# Patient Record
Sex: Female | Born: 1980 | Race: Black or African American | Hispanic: No | Marital: Single | Smoking: Former smoker
Health system: Southern US, Community
[De-identification: ages and names within clinical notes are randomized; demographics above are authoritative.]

## PROBLEM LIST (undated history)

## (undated) DIAGNOSIS — J45909 Unspecified asthma, uncomplicated: Secondary | ICD-10-CM

## (undated) HISTORY — PX: APPENDECTOMY: SHX54

---

## 2015-08-29 ENCOUNTER — Emergency Department (HOSPITAL_BASED_OUTPATIENT_CLINIC_OR_DEPARTMENT_OTHER): Payer: Medicare Other

## 2015-08-29 ENCOUNTER — Emergency Department (HOSPITAL_BASED_OUTPATIENT_CLINIC_OR_DEPARTMENT_OTHER)
Admission: EM | Admit: 2015-08-29 | Discharge: 2015-08-29 | Disposition: A | Discharge status: Home / Self Care | Payer: Medicare Other | Attending: Emergency Medicine | Admitting: Emergency Medicine

## 2015-08-29 ENCOUNTER — Encounter (HOSPITAL_BASED_OUTPATIENT_CLINIC_OR_DEPARTMENT_OTHER): Payer: Self-pay | Admitting: *Deleted

## 2015-08-29 DIAGNOSIS — F17210 Nicotine dependence, cigarettes, uncomplicated: Secondary | ICD-10-CM | POA: Diagnosis not present

## 2015-08-29 DIAGNOSIS — M25561 Pain in right knee: Secondary | ICD-10-CM | POA: Diagnosis present

## 2015-08-29 IMAGING — DX DG KNEE COMPLETE 4+V*R*
4 series · 4 of 4 positions shown · non-contrast
Comparison: None.

CLINICAL DATA: Right knee pain for 2 days, no known injury, initial
encounter

EXAM:
RIGHT KNEE - COMPLETE 4+ VIEW

[knee ap]
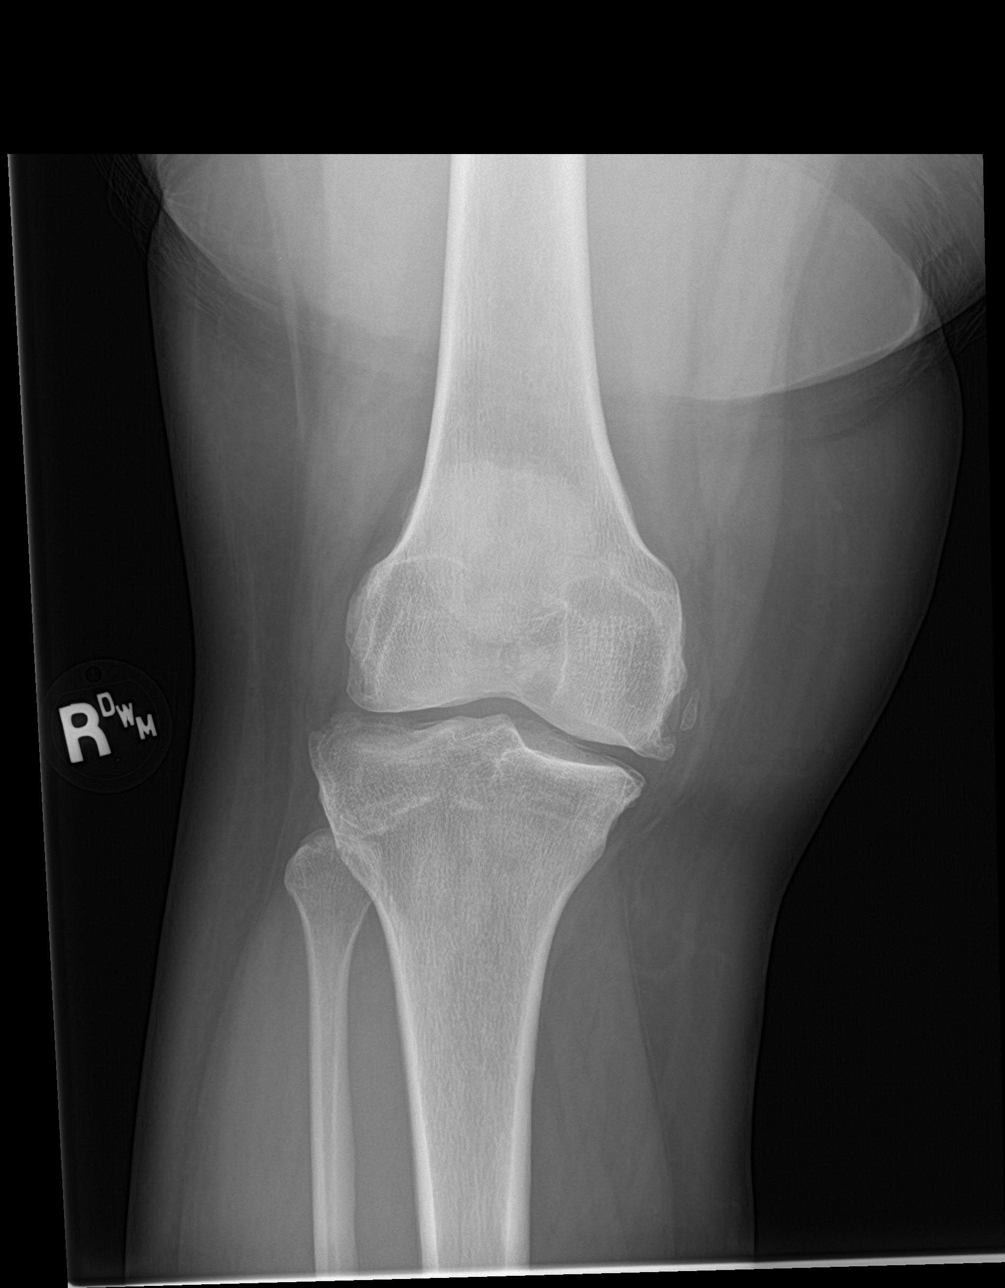

[knee lat]
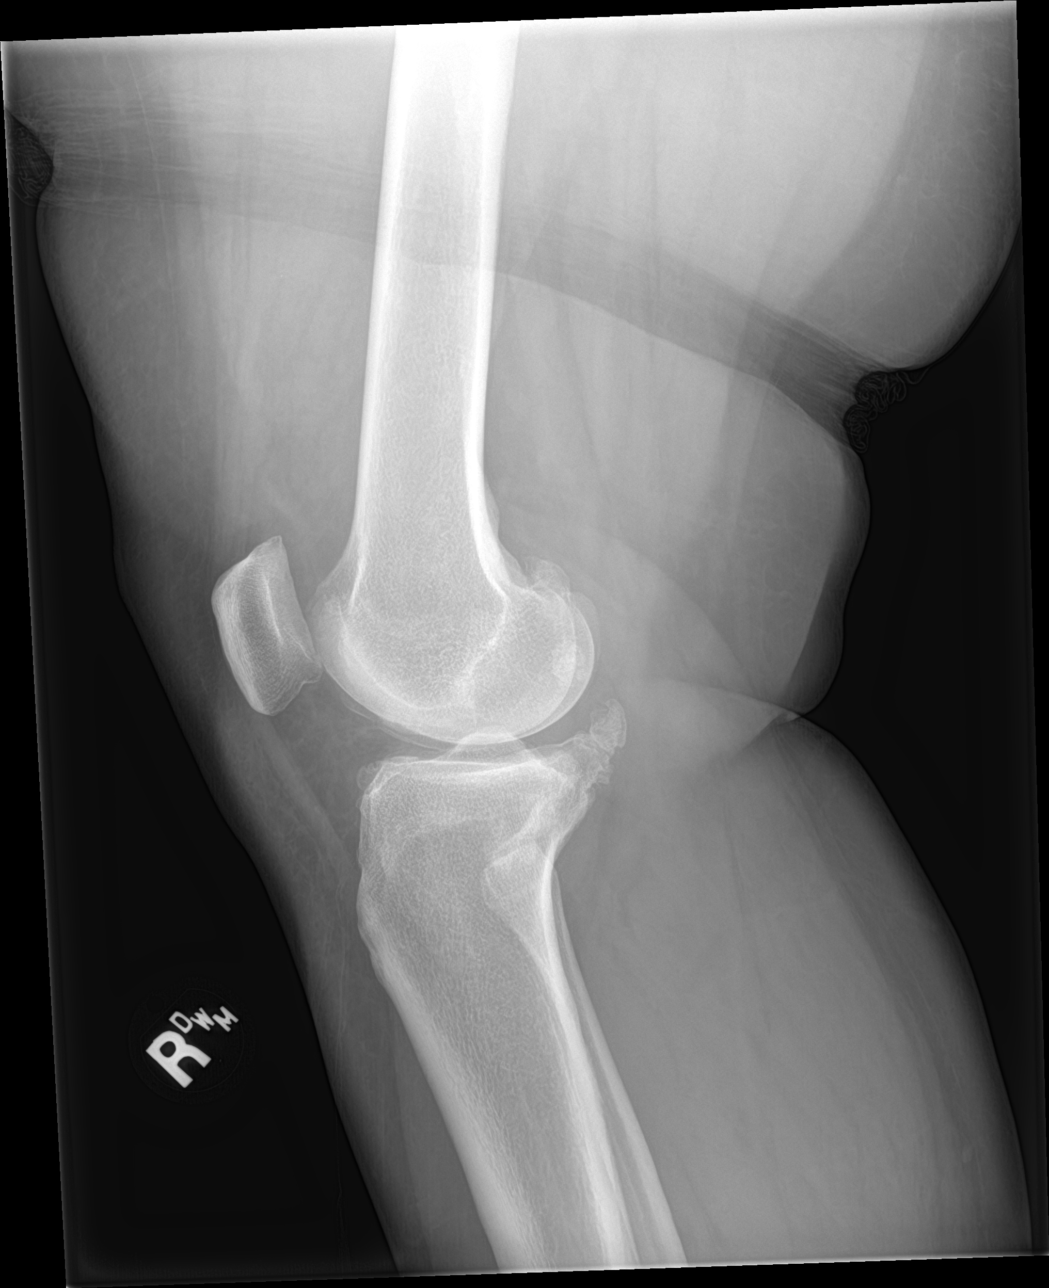

[knee obl (1 of 2)]
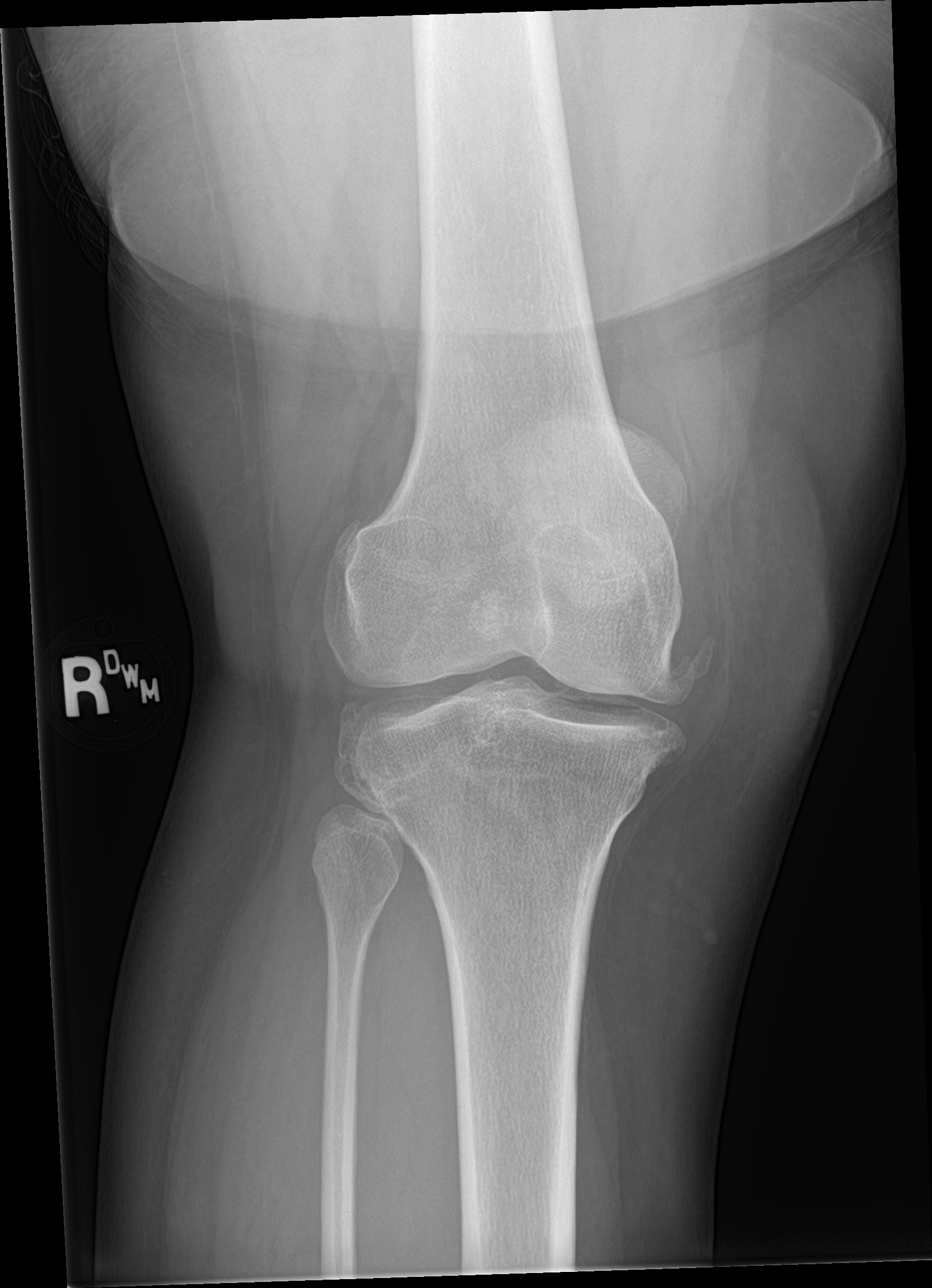

[knee obl (2 of 2)]
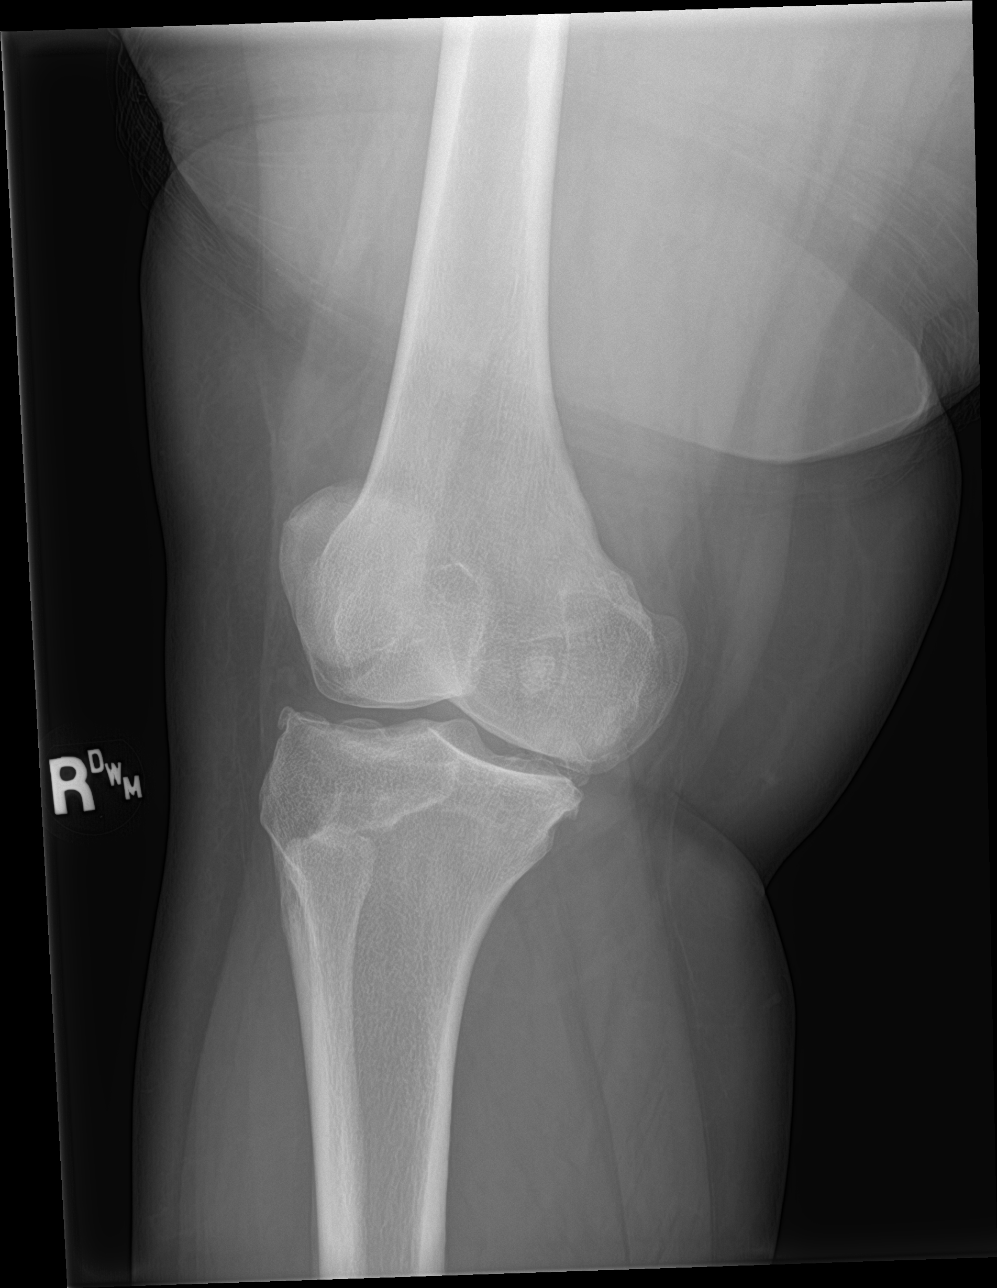

[4 of 4 positions shown; findings below may reference images not displayed]

FINDINGS: Significant degenerative changes are noted in all 3 joint
compartments. No joint effusion, acute fracture or dislocation is
noted. No gross soft tissue abnormality is seen.
IMPRESSION: Tricompartmental degenerative change without acute abnormality.

## 2015-08-29 MED ORDER — HYDROCODONE-ACETAMINOPHEN 5-325 MG PO TABS
1.0000 | ORAL_TABLET | Freq: Once | ORAL | Status: AC
  Administered 2015-08-29: 1 via ORAL
  Filled 2015-08-29: qty 1

## 2015-08-29 MED ORDER — IBUPROFEN 800 MG PO TABS: 800.0000 mg | ORAL_TABLET | Freq: Three times a day (TID) | ORAL | Status: DC

## 2015-08-29 NOTE — ED Notes (Signed)
CMS intact before and after. Pt tolerated well. All pt's questions answered.

## 2015-08-29 NOTE — ED Notes (Signed)
Right knee pain x 2 days but worse the past 2 hours. She denies injury.

## 2015-08-29 NOTE — ED Provider Notes (Addendum)
CSN: [id number]     Arrival date & time 08/29/15  1804 History  By signing my name below, I, Altamease Oiler, attest that this documentation has been prepared under the direction and in the presence of Merrily Pew, MD. Electronically Signed: Altamease Oiler, ED Scribe. 08/29/2015. 6:48 PM   Chief Complaint  Patient presents with  . Knee Pain    The history is provided by the patient. No language interpreter was used.   Angela Ayers is a 35 y.o. female who presents to the Emergency Department complaining of intermittent, 7/10 in severity, right knee pain with onset 2 days ago. The pain is exacerbated by bending the knee and standing for long periods. Pt has been taking 1000 mg of ibuprofen TID without sufficient relief in pain. She states that at night she has tingling in the knee and right leg and a heavy sensation after the tingling resolves. She has had no recent falls or trauma to the knee. Pt denies fever and hip pain.  She has no PCP.   History reviewed. No pertinent past medical history. Past Surgical History  Procedure Laterality Date  . Appendectomy     No family history on file. Social History  Substance Use Topics  . Smoking status: Current Some Day Smoker    Types: Cigarettes  . Smokeless tobacco: None  . Alcohol Use: No   OB History    No data available     Review of Systems  Musculoskeletal: Positive for arthralgias (right knee ).  All other systems reviewed and are negative.   Allergies  Latex  Home Medications   Prior to Admission medications   Medication Sig Start Date End Date Taking? Authorizing Provider  ibuprofen (ADVIL,MOTRIN) 800 MG tablet Take 1 tablet (800 mg total) by mouth every 8 (eight) hours. 08/29/15   Merrily Pew, MD   BP 124/99 mmHg  Pulse 120  Temp(Src) 98.6 F (37 C) (Oral)  Resp 20  Ht 5\' 5"  (1.651 m)  Wt 250 lb (113.399 kg)  BMI 41.60 kg/m2  SpO2 100%  LMP 08/07/2015 Physical Exam  Constitutional: She appears well-developed and  well-nourished.  HENT:  Head: Normocephalic and atraumatic.  Neck: Normal range of motion.  Cardiovascular: Normal rate and regular rhythm.   Pulmonary/Chest: No stridor. No respiratory distress.  Abdominal: She exhibits no distension.  Musculoskeletal:  No redness, warmth, or obvious swelling at the right knee. No pain with ROM or with load bearing on the heel.  Normal DP pulses   No LE edema   Neurological: She is alert.  Nursing note and vitals reviewed.   ED Course  Procedures (including critical care time) DIAGNOSTIC STUDIES: Oxygen Saturation is 100% on RA,  normal by my interpretation.    COORDINATION OF CARE: 6:46 PM Discussed treatment plan which includes right knee XR and pain medication with pt at bedside and pt agreed to plan.  Labs Review Labs Reviewed - No data to display  Imaging Review Dg Knee Complete 4 Views Right  08/29/2015  CLINICAL DATA:  Right knee pain for 2 days, no known injury, initial encounter EXAM: RIGHT KNEE - COMPLETE 4+ VIEW COMPARISON:  None. FINDINGS: Significant degenerative changes are noted in all 3 joint compartments. No joint effusion, acute fracture or dislocation is noted. No gross soft tissue abnormality is seen. IMPRESSION: Tricompartmental degenerative change without acute abnormality. Electronically Signed   By: Inez Catalina M.D.   On: 08/29/2015 18:35   I have personally reviewed and evaluated these images  as part of my medical decision-making.   EKG Interpretation None      MDM   Final diagnoses:  Right knee pain    35 year old female likely osteoarthritis of her knee. Low suspicion for gout or septic knee as she does not have a warm knee and she has no pain with range of motion. Will discharge on continuous NSAIDs and PCP follow-up if not improving.  New Prescriptions: Discharge Medication List as of 08/29/2015  7:21 PM    START taking these medications   Details  ibuprofen (ADVIL,MOTRIN) 800 MG tablet Take 1 tablet (800  mg total) by mouth every 8 (eight) hours., Starting 08/29/2015, Until Discontinued, Print         I have personally and contemperaneously reviewed labs and imaging and used in my decision making as above.   A medical screening exam was performed and I feel the patient has had an appropriate workup for their chief complaint at this time and likelihood of emergent condition existing is low and thus workup can continue on an outpatient basis.. Their vital signs are stable. They have been counseled on decision, discharge, follow up and which symptoms necessitate immediate return to the emergency department.  They verbally stated understanding and agreement with plan and discharged in stable condition.    I personally performed the services described in this documentation, which was scribed in my presence. The recorded information has been reviewed and is accurate.    Merrily Pew, MD 08/29/15 [id number]  Merrily Pew, MD 08/29/15 [phone]

## 2015-12-14 ENCOUNTER — Emergency Department (HOSPITAL_BASED_OUTPATIENT_CLINIC_OR_DEPARTMENT_OTHER)
Admission: EM | Admit: 2015-12-14 | Discharge: 2015-12-14 | Disposition: A | Discharge status: Home / Self Care | Payer: Medicare Other | Attending: Emergency Medicine | Admitting: Emergency Medicine

## 2015-12-14 ENCOUNTER — Encounter (HOSPITAL_BASED_OUTPATIENT_CLINIC_OR_DEPARTMENT_OTHER): Payer: Self-pay | Admitting: *Deleted

## 2015-12-14 ENCOUNTER — Emergency Department (HOSPITAL_BASED_OUTPATIENT_CLINIC_OR_DEPARTMENT_OTHER): Payer: Medicare Other

## 2015-12-14 DIAGNOSIS — R221 Localized swelling, mass and lump, neck: Secondary | ICD-10-CM | POA: Diagnosis not present

## 2015-12-14 DIAGNOSIS — K1120 Sialoadenitis, unspecified: Secondary | ICD-10-CM | POA: Insufficient documentation

## 2015-12-14 DIAGNOSIS — F17210 Nicotine dependence, cigarettes, uncomplicated: Secondary | ICD-10-CM | POA: Diagnosis not present

## 2015-12-14 DIAGNOSIS — J029 Acute pharyngitis, unspecified: Secondary | ICD-10-CM | POA: Diagnosis present

## 2015-12-14 LAB — COMPREHENSIVE METABOLIC PANEL
ALBUMIN: 3.9 g/dL (ref 3.5–5.0)
ALK PHOS: 51 U/L (ref 38–126)
ALT: 11 U/L — AB (ref 14–54)
AST: 13 U/L — AB (ref 15–41)
Anion gap: 7 (ref 5–15)
BILIRUBIN TOTAL: 0.3 mg/dL (ref 0.3–1.2)
BUN: 10 mg/dL (ref 6–20)
CALCIUM: 9.1 mg/dL (ref 8.9–10.3)
CO2: 22 mmol/L (ref 22–32)
CREATININE: 0.68 mg/dL (ref 0.44–1.00)
Chloride: 108 mmol/L (ref 101–111)
GFR calc Af Amer: >60 mL/min (ref >60)
GFR calc non Af Amer: >60 mL/min (ref >60)
GLUCOSE: 99 mg/dL (ref 65–99)
Potassium: 3.9 mmol/L (ref 3.5–5.1)
SODIUM: 137 mmol/L (ref 135–145)
TOTAL PROTEIN: 7.3 g/dL (ref 6.5–8.1)

## 2015-12-14 LAB — CBC WITH DIFFERENTIAL/PLATELET
BASOS ABS: 0 10*3/uL (ref 0.0–0.1)
BASOS PCT: 0 %
EOS ABS: 0.4 10*3/uL (ref 0.0–0.7)
Eosinophils Relative: 5 %
HEMATOCRIT: 37.5 % (ref 36.0–46.0)
HEMOGLOBIN: 13.1 g/dL (ref 12.0–15.0)
Lymphocytes Relative: 46 %
Lymphs Abs: 3.7 10*3/uL (ref 0.7–4.0)
MCH: 29.6 pg (ref 26.0–34.0)
MCHC: 34.9 g/dL (ref 30.0–36.0)
MCV: 84.7 fL (ref 78.0–100.0)
MONOS PCT: 7 %
Monocytes Absolute: 0.6 10*3/uL (ref 0.1–1.0)
NEUTROS ABS: 3.4 10*3/uL (ref 1.7–7.7)
NEUTROS PCT: 42 %
Platelets: 362 10*3/uL (ref 150–400)
RBC: 4.43 MIL/uL (ref 3.87–5.11)
RDW: 13.1 % (ref 11.5–15.5)
WBC: 8.1 10*3/uL (ref 4.0–10.5)

## 2015-12-14 LAB — RAPID STREP SCREEN (MED CTR MEBANE ONLY): Streptococcus, Group A Screen (Direct): NEGATIVE

## 2015-12-14 LAB — HCG, SERUM, QUALITATIVE: Preg, Serum: NEGATIVE

## 2015-12-14 IMAGING — CT CT NECK W/ CM
4 of 5 series · 16 of 33 positions shown, 18 images · IV contrast (iopamidol)
Comparison: None.

CLINICAL DATA: Sore throat for 2 days with anterior neck swelling.

EXAM:
CT NECK WITH CONTRAST
TECHNIQUE: Multidetector CT imaging of the neck was performed using the
standard protocol following the bolus administration of intravenous
contrast.
CONTRAST:  75mL [S3] IOPAMIDOL ([S3]) INJECTION 61%

[Series 3: axial neck · axial · 0.44mm/px · z∈[-288,-150]mm · 4 of 116 slices shown, 5 images]
[im 24/116  soft-tissue]
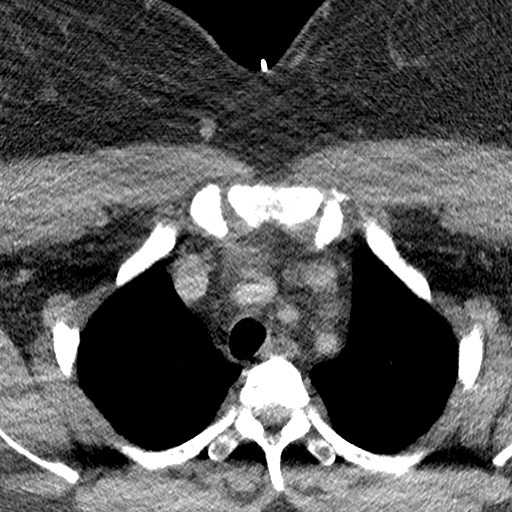
[im 24/116  bone]
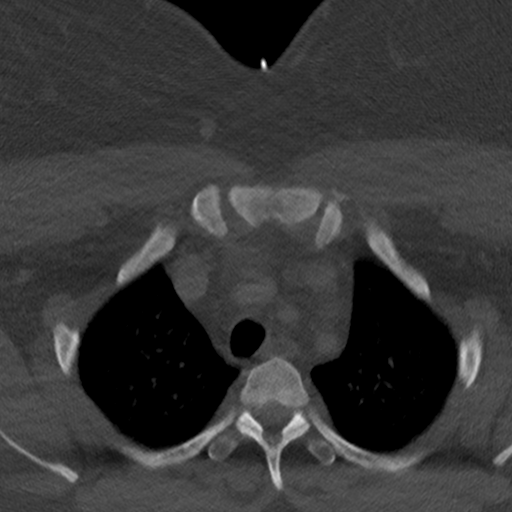
[im 47/116  bone]
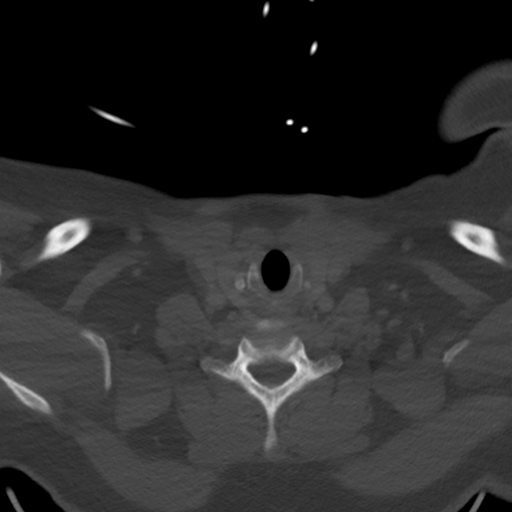
[im 70/116  bone]
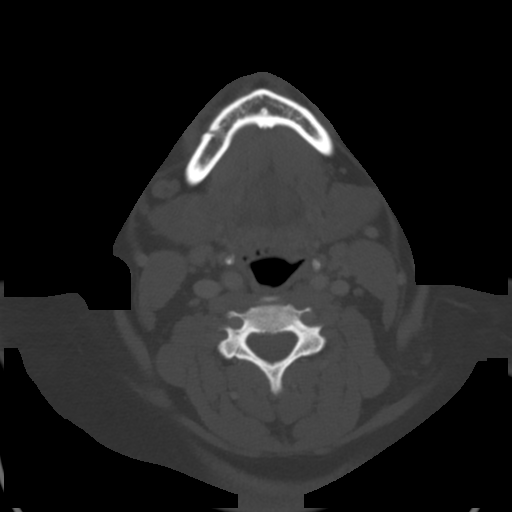
[im 93/116  bone]
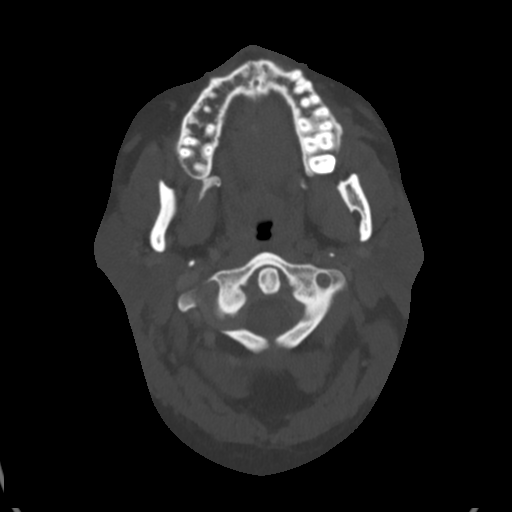

[Series 5: sag neck · sagittal · 0.52mm/px · 5 of 90 slices shown, 6 images]
[im 30/90  bone]
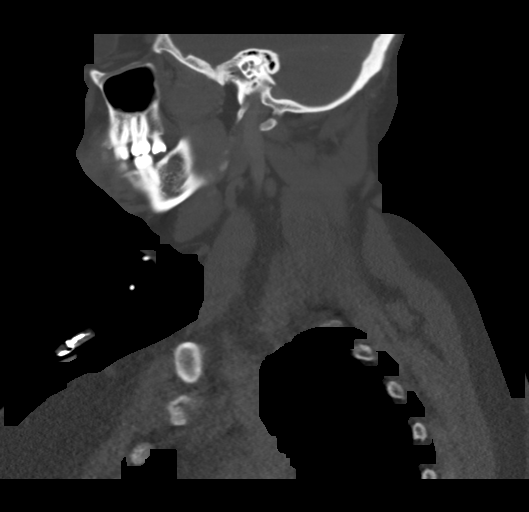
[im 38/90  bone]
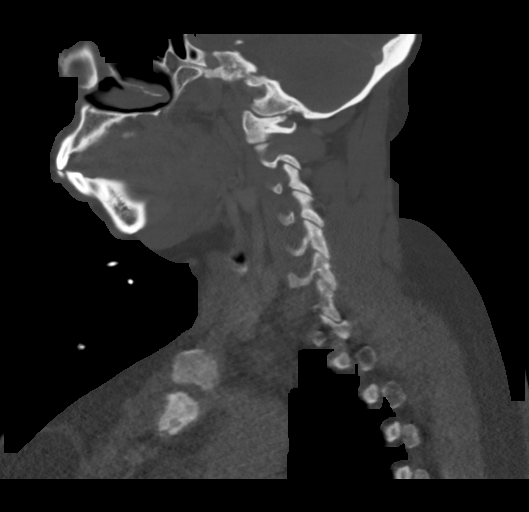
[im 45/90  soft-tissue]
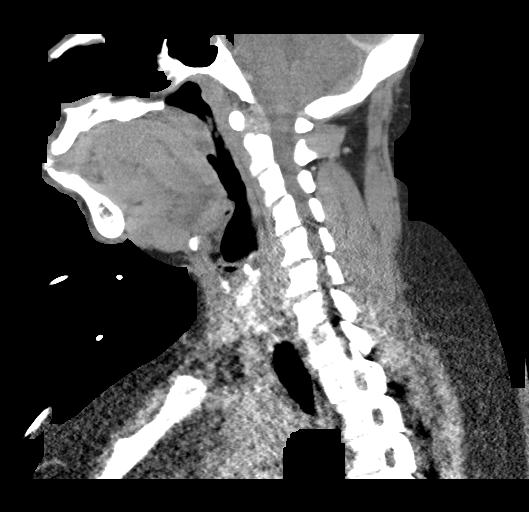
[im 45/90  bone]
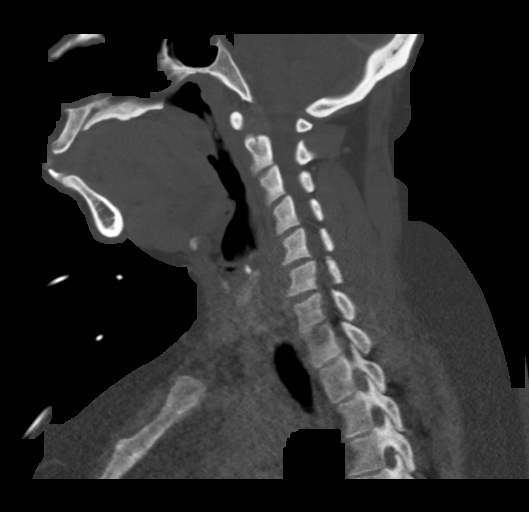
[im 52/90  bone]
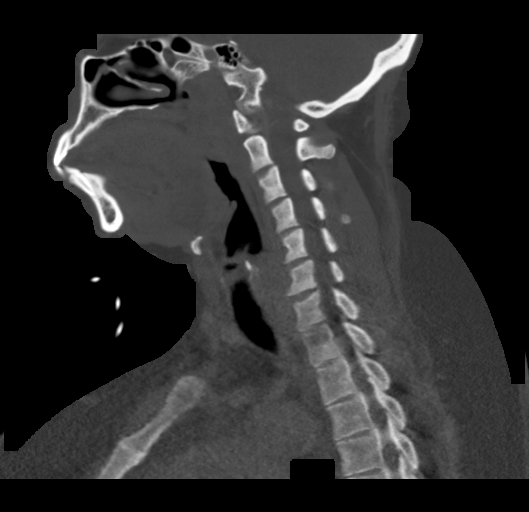
[im 60/90  bone]
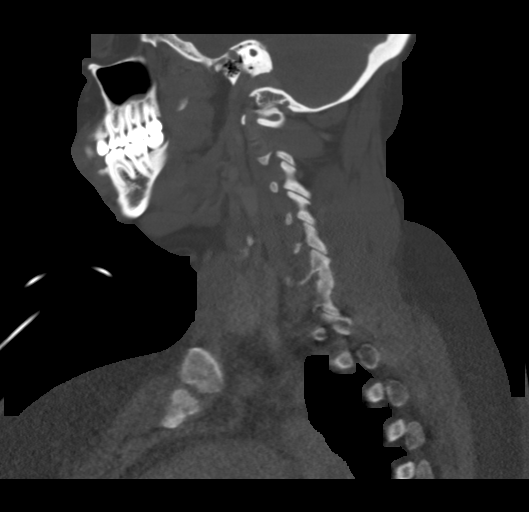

[Series 6: cor neck · coronal · 0.43mm/px · 3 of 119 slices shown]
[im 33/119  bone]
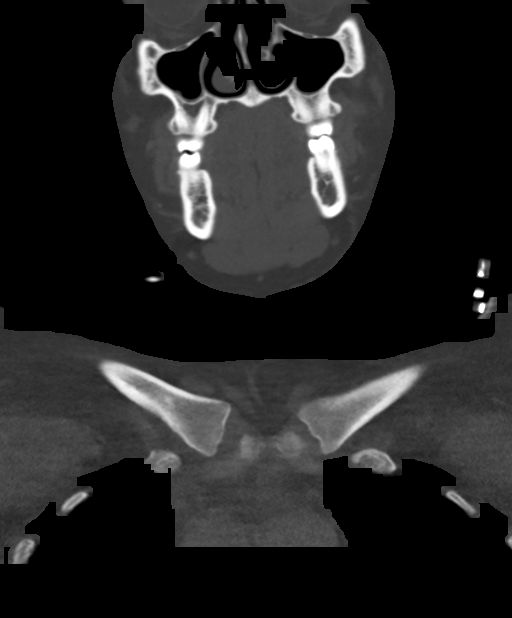
[im 51/119  bone]
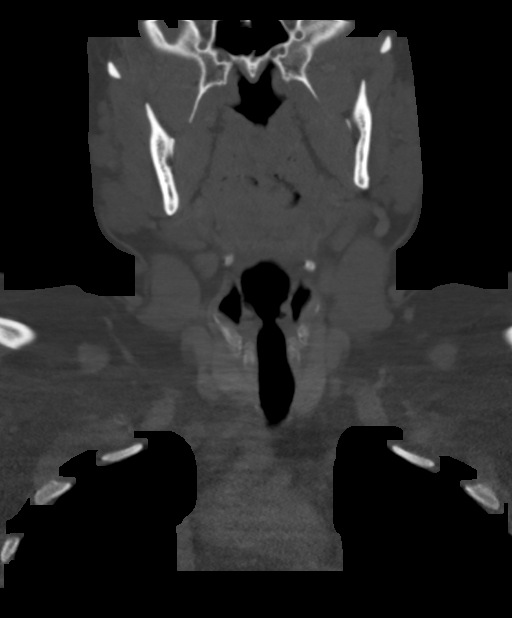
[im 69/119  bone]
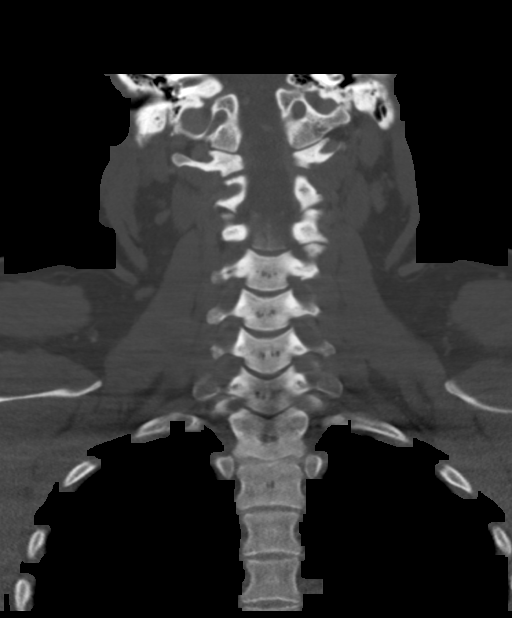

[Series 7: orthogonal ax · axial · 0.45mm/px · z∈[-308,-158]mm · 4 of 127 slices shown]
[im 26/127  bone]
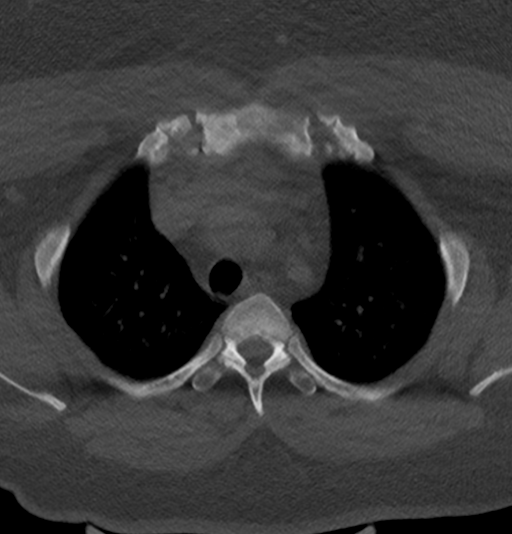
[im 51/127  bone]
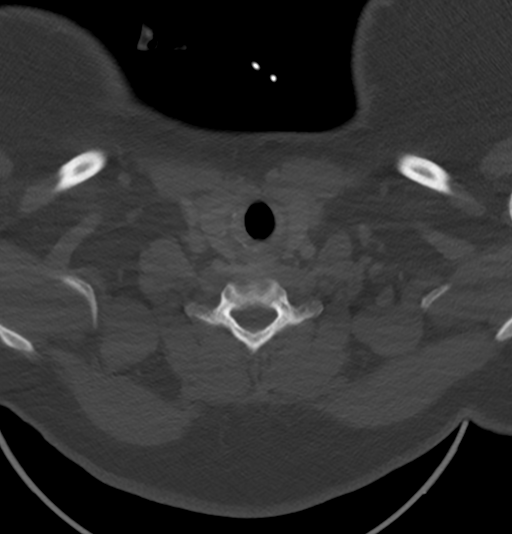
[im 76/127  bone]
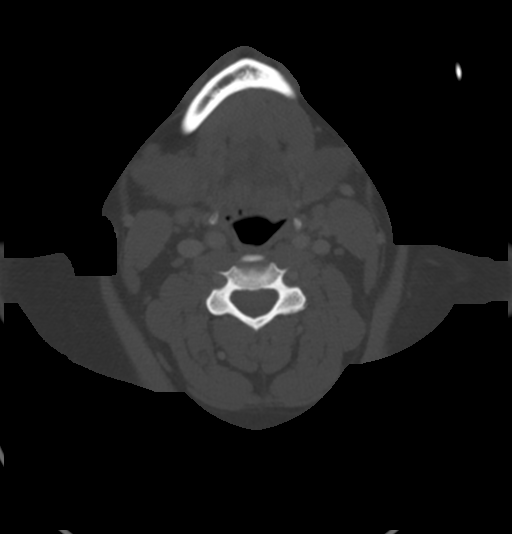
[im 101/127  bone]
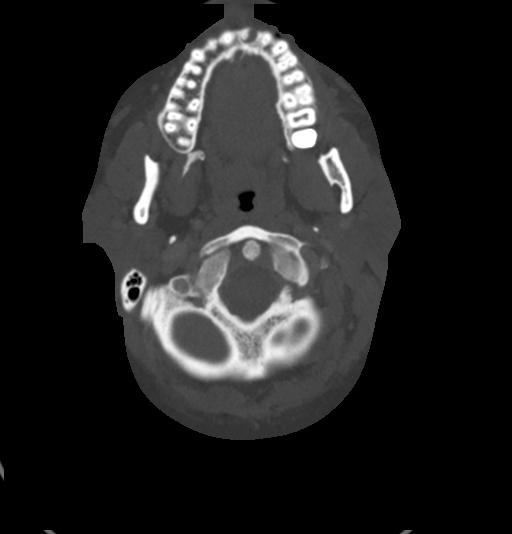

[16 of 33 positions shown; findings below may reference images not displayed]

FINDINGS: Limited by motion and soft tissue attenuation inferiorly. Exam is
overall diagnostic.

Pharynx and larynx: Normal. No mass or swelling.

Salivary glands: The submandibular glands are somewhat prominent but
symmetric and without surrounding fat inflammation. No calculi or
duct dilatation.

Thyroid: Affected by above artifact.  No evidence of thyroiditis.

Lymph nodes: None enlarged or abnormal density.

Vascular: Negative.

Limited intracranial: Negative.

Visualized orbits: Negative.

Mastoids and visualized paranasal sinuses: Clear.

Skeleton: No acute or aggressive process.

Upper chest: Negative.

Other: None.
IMPRESSION: Negative.  No explanation for symptoms.

## 2015-12-14 MED ORDER — DIPHENHYDRAMINE HCL 50 MG/ML IJ SOLN
25.0000 mg | Freq: Once | INTRAMUSCULAR | Status: AC
  Administered 2015-12-14: 25 mg via INTRAVENOUS
  Filled 2015-12-14: qty 1

## 2015-12-14 MED ORDER — SODIUM CHLORIDE 0.9 % IV SOLN
Freq: Once | INTRAVENOUS | Status: AC
  Administered 2015-12-14: 08:00:00 via INTRAVENOUS

## 2015-12-14 MED ORDER — PREDNISONE 10 MG PO TABS: 20.0000 mg | ORAL_TABLET | Freq: Every day | ORAL | 0 refills | Status: DC

## 2015-12-14 MED ORDER — IBUPROFEN 600 MG PO TABS: 600.0000 mg | ORAL_TABLET | Freq: Four times a day (QID) | ORAL | 0 refills | Status: DC | PRN

## 2015-12-14 MED ORDER — DEXAMETHASONE SODIUM PHOSPHATE 10 MG/ML IJ SOLN
10.0000 mg | Freq: Four times a day (QID) | INTRAMUSCULAR | Status: DC
  Administered 2015-12-14: 10 mg via INTRAVENOUS
  Filled 2015-12-14: qty 1

## 2015-12-14 MED ORDER — CLINDAMYCIN HCL 300 MG PO CAPS: 300.0000 mg | ORAL_CAPSULE | Freq: Four times a day (QID) | ORAL | 0 refills | Status: DC

## 2015-12-14 MED ORDER — IOPAMIDOL (ISOVUE-300) INJECTION 61%
75.0000 mL | Freq: Once | INTRAVENOUS | Status: AC | PRN
  Administered 2015-12-14: 75 mL via INTRAVENOUS

## 2015-12-14 MED ORDER — CLINDAMYCIN PHOSPHATE 600 MG/50ML IV SOLN
600.0000 mg | Freq: Once | INTRAVENOUS | Status: AC
  Administered 2015-12-14: 600 mg via INTRAVENOUS
  Filled 2015-12-14: qty 50

## 2015-12-14 MED ORDER — KETOROLAC TROMETHAMINE 30 MG/ML IJ SOLN
30.0000 mg | Freq: Once | INTRAMUSCULAR | Status: AC
  Administered 2015-12-14: 30 mg via INTRAVENOUS
  Filled 2015-12-14: qty 1

## 2015-12-14 MED FILL — IBUPROFEN 600 MG TABLET: 600 | 8 days supply | Qty: 30 | Fill #0

## 2015-12-14 MED FILL — predniSONE 10 MG TABS: 10 | 5 days supply | Qty: 10 | Fill #0

## 2015-12-14 MED FILL — CLINDAMYCIN HCL 300 MG CAPS: 300 | 7 days supply | Qty: 28 | Fill #0

## 2015-12-14 NOTE — ED Triage Notes (Signed)
C/o sore throat 2 days ago. Now has bil ear pain. Hurts to swallow.

## 2015-12-14 NOTE — ED Notes (Signed)
Pt states she feels better. States her throat does not feel as swelled.

## 2015-12-14 NOTE — ED Notes (Signed)
Pt directed to pharmacy to pick up prescriptions

## 2015-12-14 NOTE — ED Notes (Signed)
MD at bedside. 

## 2015-12-14 NOTE — ED Provider Notes (Signed)
[place] [facility] Provider Note   CSN: [id number] Arrival date & time: 12/14/15  [id number]     History   Chief Complaint Chief Complaint  Patient presents with  . Sore Throat    HPI Angela Ayers is a 35 y.o. female.  HPI Patient presents with 2 days of gradual throat swelling and pain with swallowing. States she's having trouble lying flat and tolerating secretions. No fever or chills. No new rashes or exposures. Denies any voice changes. History reviewed. No pertinent past medical history.  There are no active problems to display for this patient.   Past Surgical History:  Procedure Laterality Date  . APPENDECTOMY      OB History    No data available       Home Medications    Prior to Admission medications   Medication Sig Start Date End Date Taking? Authorizing Provider  clindamycin (CLEOCIN) 300 MG capsule Take 1 capsule (300 mg total) by mouth 4 (four) times daily. X 7 days 12/14/15   Julianne Rice, MD  ibuprofen (ADVIL,MOTRIN) 600 MG tablet Take 1 tablet (600 mg total) by mouth every 6 (six) hours as needed. 12/14/15   Julianne Rice, MD  predniSONE (DELTASONE) 10 MG tablet Take 2 tablets (20 mg total) by mouth daily. 12/14/15   Julianne Rice, MD    Family History No family history on file.  Social History Social History  Substance Use Topics  . Smoking status: Current Some Day Smoker    Packs/day: 0.50    Types: Cigarettes  . Smokeless tobacco: Never Used  . Alcohol use No     Allergies   Latex   Review of Systems Review of Systems  Constitutional: Negative for chills and fever.  HENT: Positive for facial swelling, sore throat and trouble swallowing. Negative for congestion, rhinorrhea and voice change.   Respiratory: Negative for shortness of breath, wheezing and stridor.   Cardiovascular: Negative for chest pain.  Musculoskeletal: Positive for neck pain. Negative for neck stiffness.  Skin: Negative for rash.  All other systems reviewed  and are negative.    Physical Exam Updated Vital Signs BP 119/85 (BP Location: Left Arm)   Pulse 78   Temp 97.8 F (36.6 C) (Oral)   Resp 18   Ht 5\' 5"  (1.651 m)   Wt 250 lb (113.4 kg)   LMP 11/24/2015   SpO2 95%   BMI 41.60 kg/m   Physical Exam  Constitutional: She is oriented to person, place, and time. She appears well-developed and well-nourished.  Patient appears uncomfortable. Leaning forward and drooling.  HENT:  Head: Normocephalic and atraumatic.  Mouth/Throat: Oropharynx is clear and moist.  Oropharynx is mildly erythematous. No obvious asymmetric masses.  Eyes: EOM are normal. Pupils are equal, round, and reactive to light.  Neck: Normal range of motion. Neck supple.  Fullness bilaterally under the mandible. Diffuse tenderness to palpation.  Cardiovascular: Normal rate and regular rhythm.   Pulmonary/Chest: Effort normal and breath sounds normal. No stridor. No respiratory distress. She has no wheezes. She has no rales. She exhibits no tenderness.  Abdominal: Soft. Bowel sounds are normal. There is no tenderness. There is no rebound and no guarding.  Musculoskeletal: Normal range of motion. She exhibits no edema or tenderness.  Lymphadenopathy:    She has cervical adenopathy.  Neurological: She is alert and oriented to person, place, and time.  Moving all extremities without deficit. Sensation intact.  Skin: Skin is warm and dry. No rash noted. No erythema.  Psychiatric: She has a normal mood and affect. Her behavior is normal.  Nursing note and vitals reviewed.    ED Treatments / Results  Labs (all labs ordered are listed, but only abnormal results are displayed) Labs Reviewed  COMPREHENSIVE METABOLIC PANEL - Abnormal; Notable for the following:       Result Value   AST 13 (*)    ALT 11 (*)    All other components within normal limits  RAPID STREP SCREEN (NOT AT [facility])  CULTURE, GROUP A STREP ([facility])  CBC WITH DIFFERENTIAL/PLATELET  HCG, SERUM,  QUALITATIVE    EKG  EKG Interpretation None       Radiology Ct Soft Tissue Neck W Contrast  Result Date: 12/14/2015 CLINICAL DATA:  Sore throat for 2 days with anterior neck swelling. EXAM: CT NECK WITH CONTRAST TECHNIQUE: Multidetector CT imaging of the neck was performed using the standard protocol following the bolus administration of intravenous contrast. CONTRAST:  75mL ISOVUE-300 IOPAMIDOL (ISOVUE-300) INJECTION 61% COMPARISON:  None. FINDINGS: Limited by motion and soft tissue attenuation inferiorly. Exam is overall diagnostic. Pharynx and larynx: Normal. No mass or swelling. Salivary glands: The submandibular glands are somewhat prominent but symmetric and without surrounding fat inflammation. No calculi or duct dilatation. Thyroid: Affected by above artifact.  No evidence of thyroiditis. Lymph nodes: None enlarged or abnormal density. Vascular: Negative. Limited intracranial: Negative. Visualized orbits: Negative. Mastoids and visualized paranasal sinuses: Clear. Skeleton: No acute or aggressive process. Upper chest: Negative. Other: None. IMPRESSION: Negative.  No explanation for symptoms. Electronically Signed   By: Marnee SpringJonathon  Watts M.D.   On: 12/14/2015 09:26    Procedures Procedures (including critical care time)  Medications Ordered in ED Medications  dexamethasone (DECADRON) injection 10 mg (10 mg Intravenous Given 12/14/15 0816)  diphenhydrAMINE (BENADRYL) injection 25 mg (25 mg Intravenous Given 12/14/15 0817)  ketorolac (TORADOL) 30 MG/ML injection 30 mg (30 mg Intravenous Given 12/14/15 0817)  0.9 %  sodium chloride infusion ( Intravenous New Bag/Given 12/14/15 0817)  iopamidol (ISOVUE-300) 61 % injection 75 mL (75 mLs Intravenous Contrast Given 12/14/15 0850)  clindamycin (CLEOCIN) IVPB 600 mg (0 mg Intravenous Stopped 12/14/15 1333)     Initial Impression / Assessment and Plan / ED Course  I have reviewed the triage vital signs and the nursing notes.  Pertinent labs &  imaging results that were available during my care of the patient were reviewed by me and considered in my medical decision making (see chart for details).  Clinical Course    Patient moved to trauma room. Will have emergent airway cart at bedside. We'll observe very closely. Given Decadron and Benadryl, though this may possibly be infectious.  Swelling has improved. Patient states her throat is not near as sore and she is tolerating her secretions better. CT without any acute findings.  Patient's doing much better. She is resting comfortably. She is in no respiratory distress. She is tolerating her secretions. Voice is clear. Patient has been observed for 5 hours in the emergency department. We'll discharge home with strict return precautions to return for any worsening of her symptoms or for any concerns. We'll give 5 day supply of prednisone and follow-up with ENT.  Discussed with Dr. Suszanne Connerseoh on. Recommend starting clindamycin. Will see her in the next 1 to 2 days. Final Clinical Impressions(s) / ED Diagnoses   Final diagnoses:  Throat swelling  Sialadenitis    New Prescriptions New Prescriptions   CLINDAMYCIN (CLEOCIN) 300 MG CAPSULE    Take 1  capsule (300 mg total) by mouth 4 (four) times daily. X 7 days   IBUPROFEN (ADVIL,MOTRIN) 600 MG TABLET    Take 1 tablet (600 mg total) by mouth every 6 (six) hours as needed.   PREDNISONE (DELTASONE) 10 MG TABLET    Take 2 tablets (20 mg total) by mouth daily.     Julianne Rice, MD 12/14/15 1416

## 2015-12-14 NOTE — ED Notes (Signed)
Pt moved to Room 14 per Dr Lita Mains request. Pt placed on cardiac monitor with pulse ox.

## 2015-12-17 LAB — CULTURE, GROUP A STREP (THRC)

## 2016-01-12 ENCOUNTER — Other Ambulatory Visit (INDEPENDENT_AMBULATORY_CARE_PROVIDER_SITE_OTHER): Payer: Self-pay | Admitting: Otolaryngology

## 2016-01-12 DIAGNOSIS — R1310 Dysphagia, unspecified: Secondary | ICD-10-CM

## 2016-01-19 ENCOUNTER — Ambulatory Visit (HOSPITAL_COMMUNITY)
Admission: RE | Admit: 2016-01-19 | Discharge: 2016-01-19 | Disposition: A | Discharge status: Home / Self Care | Payer: Medicare Other | Source: Ambulatory Visit | Attending: Otolaryngology | Admitting: Otolaryngology

## 2016-01-19 DIAGNOSIS — R1310 Dysphagia, unspecified: Secondary | ICD-10-CM

## 2016-01-19 DIAGNOSIS — R1312 Dysphagia, oropharyngeal phase: Secondary | ICD-10-CM | POA: Insufficient documentation

## 2016-01-19 DIAGNOSIS — K219 Gastro-esophageal reflux disease without esophagitis: Secondary | ICD-10-CM | POA: Insufficient documentation

## 2016-01-19 IMAGING — RF DG ESOPHAGUS
10 of 14 series · 15 of 24 positions shown · non-contrast
Comparison: None.

CLINICAL DATA: Difficulty swallowing

EXAM:
ESOPHOGRAM / BARIUM SWALLOW / BARIUM TABLET STUDY
TECHNIQUE: Combined double contrast and single contrast examination performed
using effervescent crystals, thick barium liquid, and thin barium
liquid. The patient was observed with fluoroscopy swallowing a 13 mm
barium sulphate tablet.
FLUOROSCOPY TIME:  Fluoroscopy Time:  1 minutes
Radiation Exposure Index (if provided by the fluoroscopic device):
8.7 mGy
Number of Acquired Spot Images: 0

[Series 1: cp_standard · 0.51mm/px · 2 of 52 frames shown (1 of 10)]
[frame 1/52]
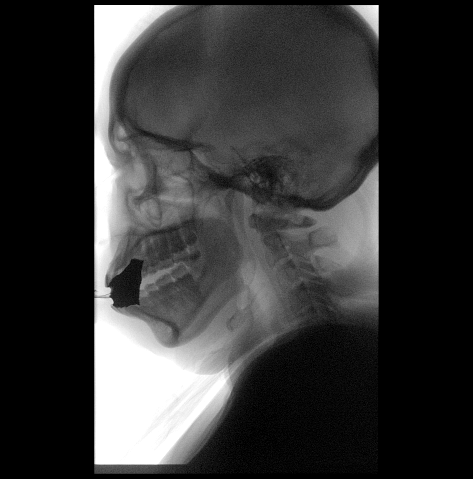
[frame 27/52]
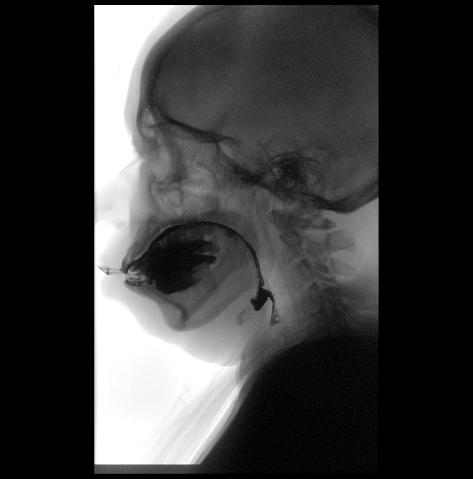

[Series 2: cp_standard · 0.51mm/px · 2 of 64 frames shown (2 of 10)]
[frame 33/64]
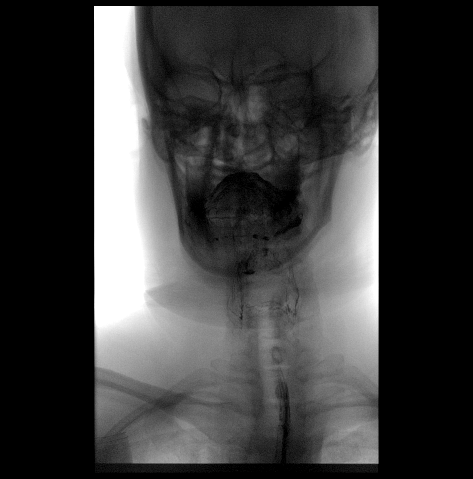
[frame 39/64]
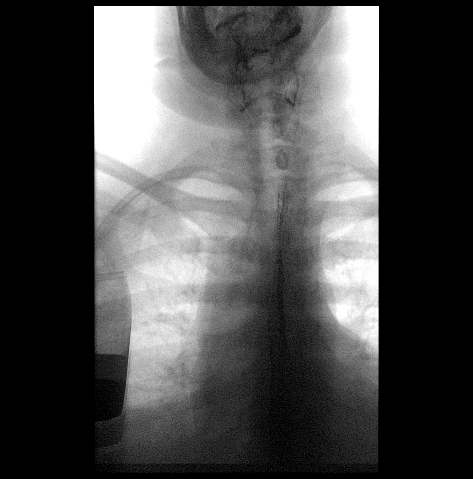

[Series 3: cp_standard · 0.26mm/px · 1 of 1 slices shown (3 of 10)]
[im 1/1]
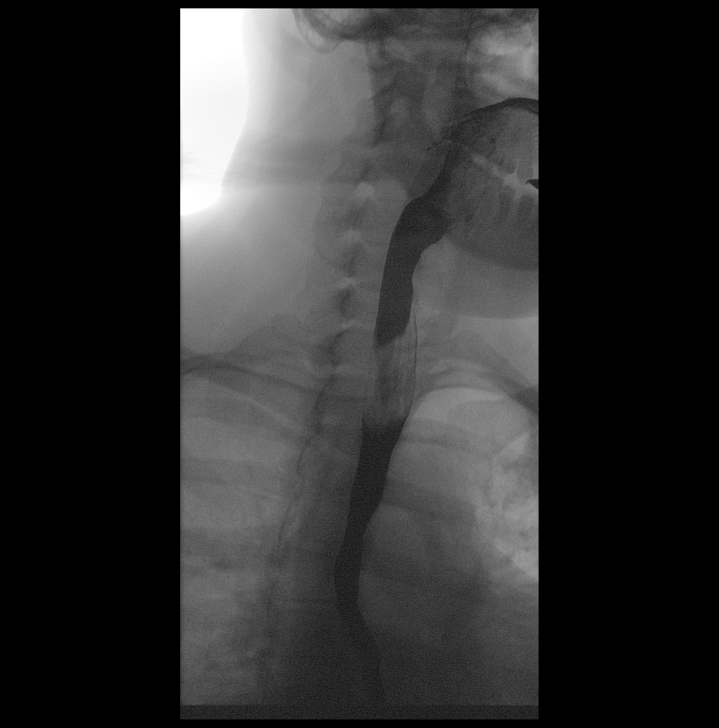

[Series 4: cp_standard · 0.26mm/px · 1 of 1 slices shown (4 of 10)]
[im 1/1]
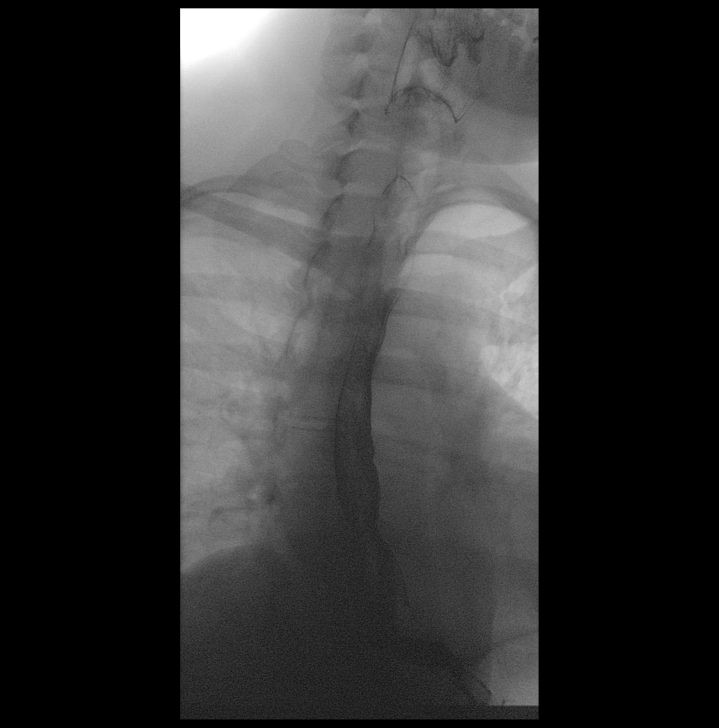

[Series 7: cp_standard · 0.51mm/px · 3 of 34 frames shown (5 of 10)]
[frame 6/34]
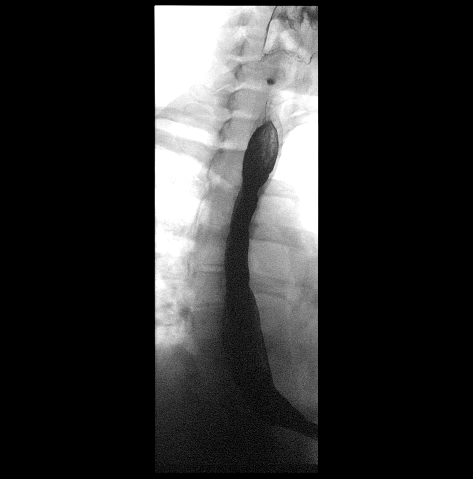
[frame 29/34]
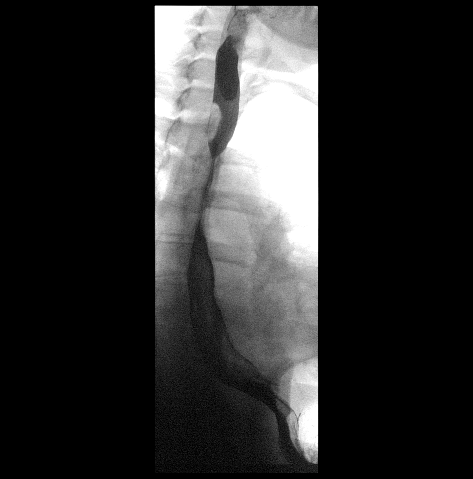
[frame 34/34]
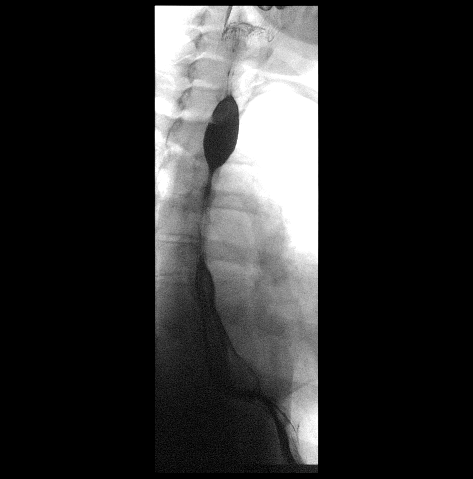

[Series 10: cp_standard · 0.26mm/px · 1 of 1 slices shown (6 of 10)]
[im 1/1]
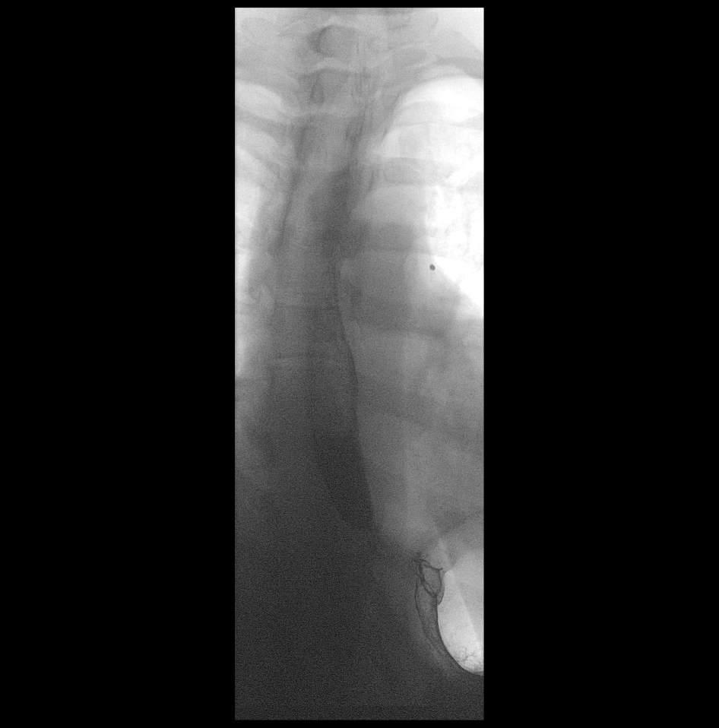

[Series 11: cp_standard · 0.26mm/px · 1 of 1 slices shown (7 of 10)]
[im 1/1]
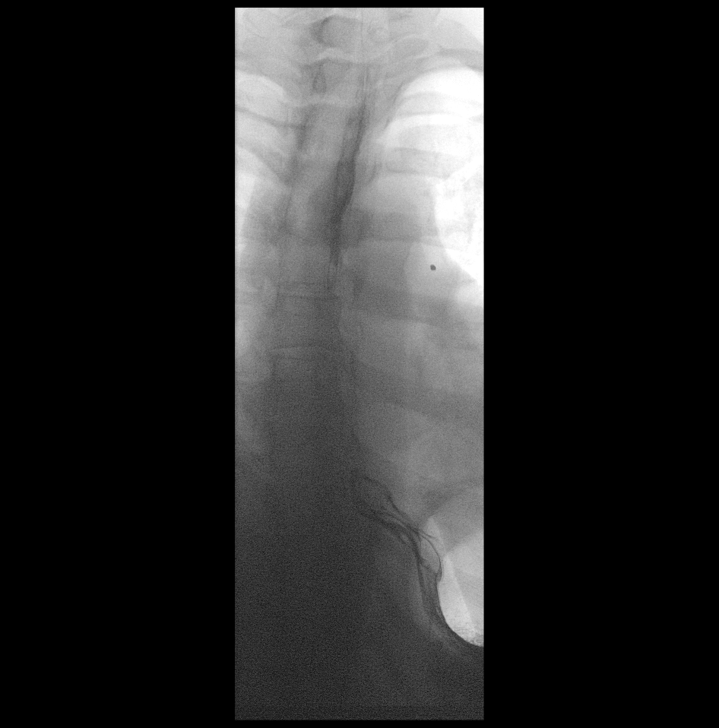

[Series 13: cp_standard · 0.53mm/px · 2 of 22 frames shown (8 of 10)]
[frame 4/22]
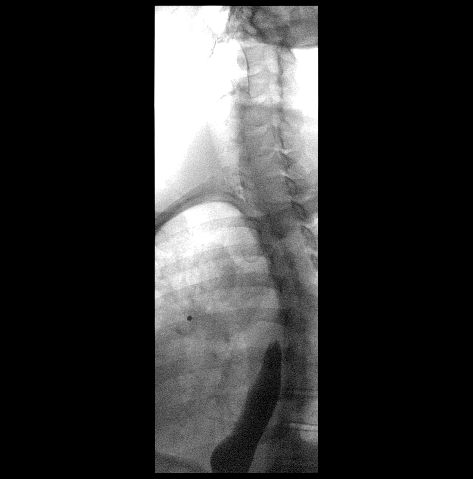
[frame 12/22]
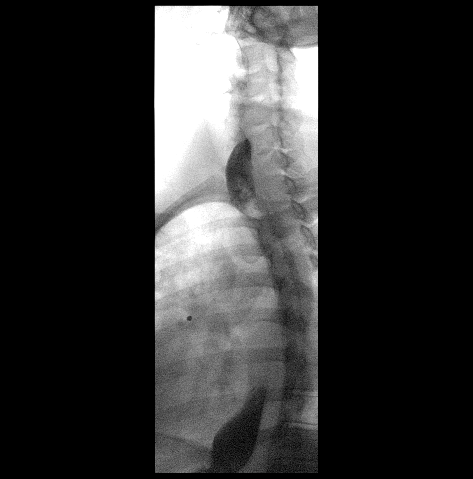

[Series 14: cp_standard · 0.27mm/px · 1 of 1 slices shown (9 of 10)]
[im 1/1]
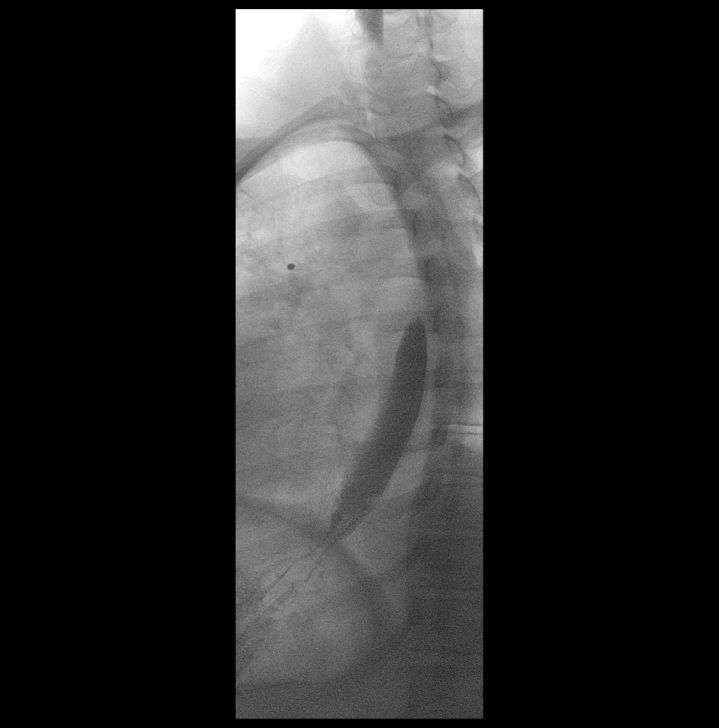

[Series 16: cp_standard · 0.28mm/px · 1 of 1 slices shown (10 of 10)]
[im 1/1]
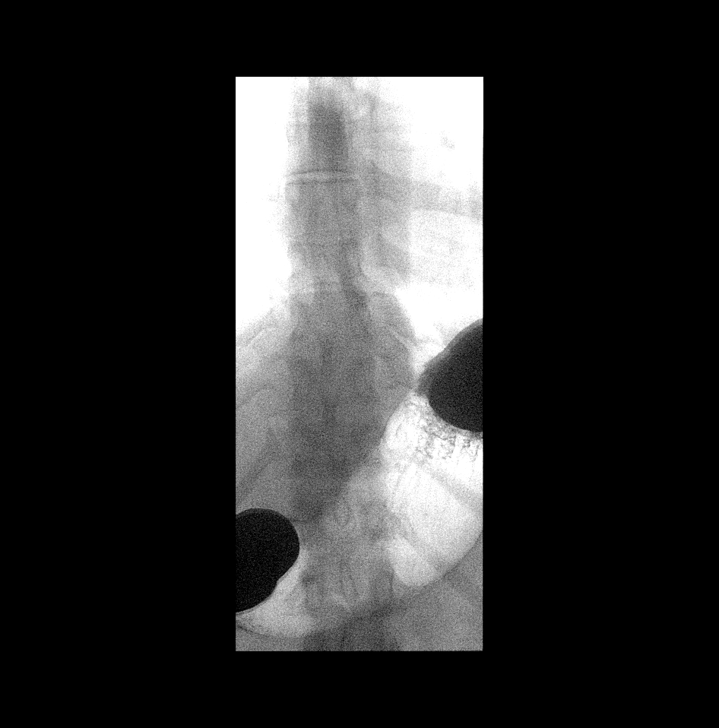

[15 of 24 positions shown; findings below may reference images not displayed]

FINDINGS: There was normal pharyngeal anatomy. Delayed oropharyngeal phase of
swallowing. Contrast flowed freely through the esophagus without
evidence of stricture or mass. There was normal esophageal mucosa
without evidence of irregularity or ulceration. Esophageal motility
was normal. Mild gastroesophageal reflux. No definite hiatal hernia
was demonstrated.

At the end of the examination a 13 mm barium tablet was administered
which transited through the esophagus and esophagogastric junction
without delay.
IMPRESSION: 1. Mild gastroesophageal reflux.
2. Delayed oropharyngeal phase of swallowing. If there is further
clinical concern, recommend evaluation with speech pathology.

## 2016-02-10 ENCOUNTER — Other Ambulatory Visit (HOSPITAL_COMMUNITY): Payer: Self-pay | Admitting: Specialist

## 2016-02-10 DIAGNOSIS — R1319 Other dysphagia: Secondary | ICD-10-CM

## 2016-03-05 ENCOUNTER — Ambulatory Visit (HOSPITAL_COMMUNITY)
Admission: RE | Admit: 2016-03-05 | Discharge: 2016-03-05 | Disposition: A | Discharge status: Home / Self Care | Payer: Medicare Other | Source: Ambulatory Visit | Attending: Otolaryngology | Admitting: Otolaryngology

## 2016-03-05 ENCOUNTER — Other Ambulatory Visit (HOSPITAL_COMMUNITY): Payer: Self-pay | Admitting: Otolaryngology

## 2016-03-05 ENCOUNTER — Ambulatory Visit (HOSPITAL_COMMUNITY): Payer: Medicare Other | Admitting: Speech Pathology

## 2016-03-05 ENCOUNTER — Inpatient Hospital Stay (HOSPITAL_COMMUNITY): Admission: RE | Admit: 2016-03-05 | Payer: Medicare Other | Source: Ambulatory Visit

## 2016-03-05 ENCOUNTER — Telehealth (HOSPITAL_COMMUNITY): Payer: Self-pay | Admitting: Speech Pathology

## 2016-03-05 DIAGNOSIS — R1310 Dysphagia, unspecified: Secondary | ICD-10-CM | POA: Diagnosis present

## 2016-03-05 DIAGNOSIS — R1319 Other dysphagia: Secondary | ICD-10-CM | POA: Diagnosis not present

## 2016-03-05 NOTE — Telephone Encounter (Signed)
Patient was seen at [facility] per [place]. NF 03/05/16

## 2016-08-05 ENCOUNTER — Emergency Department (HOSPITAL_BASED_OUTPATIENT_CLINIC_OR_DEPARTMENT_OTHER)
Admission: EM | Admit: 2016-08-05 | Discharge: 2016-08-05 | Disposition: A | Discharge status: Home / Self Care | Payer: Medicare Other | Attending: Emergency Medicine | Admitting: Emergency Medicine

## 2016-08-05 ENCOUNTER — Encounter (HOSPITAL_BASED_OUTPATIENT_CLINIC_OR_DEPARTMENT_OTHER): Payer: Self-pay | Admitting: Emergency Medicine

## 2016-08-05 ENCOUNTER — Emergency Department (HOSPITAL_BASED_OUTPATIENT_CLINIC_OR_DEPARTMENT_OTHER): Payer: Medicare Other

## 2016-08-05 DIAGNOSIS — Y999 Unspecified external cause status: Secondary | ICD-10-CM | POA: Diagnosis not present

## 2016-08-05 DIAGNOSIS — S93492A Sprain of other ligament of left ankle, initial encounter: Secondary | ICD-10-CM | POA: Diagnosis not present

## 2016-08-05 DIAGNOSIS — W010XXA Fall on same level from slipping, tripping and stumbling without subsequent striking against object, initial encounter: Secondary | ICD-10-CM | POA: Diagnosis not present

## 2016-08-05 DIAGNOSIS — Y939 Activity, unspecified: Secondary | ICD-10-CM | POA: Insufficient documentation

## 2016-08-05 DIAGNOSIS — Y929 Unspecified place or not applicable: Secondary | ICD-10-CM | POA: Insufficient documentation

## 2016-08-05 DIAGNOSIS — F17210 Nicotine dependence, cigarettes, uncomplicated: Secondary | ICD-10-CM | POA: Diagnosis not present

## 2016-08-05 DIAGNOSIS — S99912A Unspecified injury of left ankle, initial encounter: Secondary | ICD-10-CM | POA: Diagnosis present

## 2016-08-05 IMAGING — DX DG ANKLE COMPLETE 3+V*L*
3 series · 3 of 3 positions shown · non-contrast
Comparison: None.

CLINICAL DATA: Pain after trauma.

EXAM:
LEFT ANKLE COMPLETE - 3+ VIEW

[ankle ap]
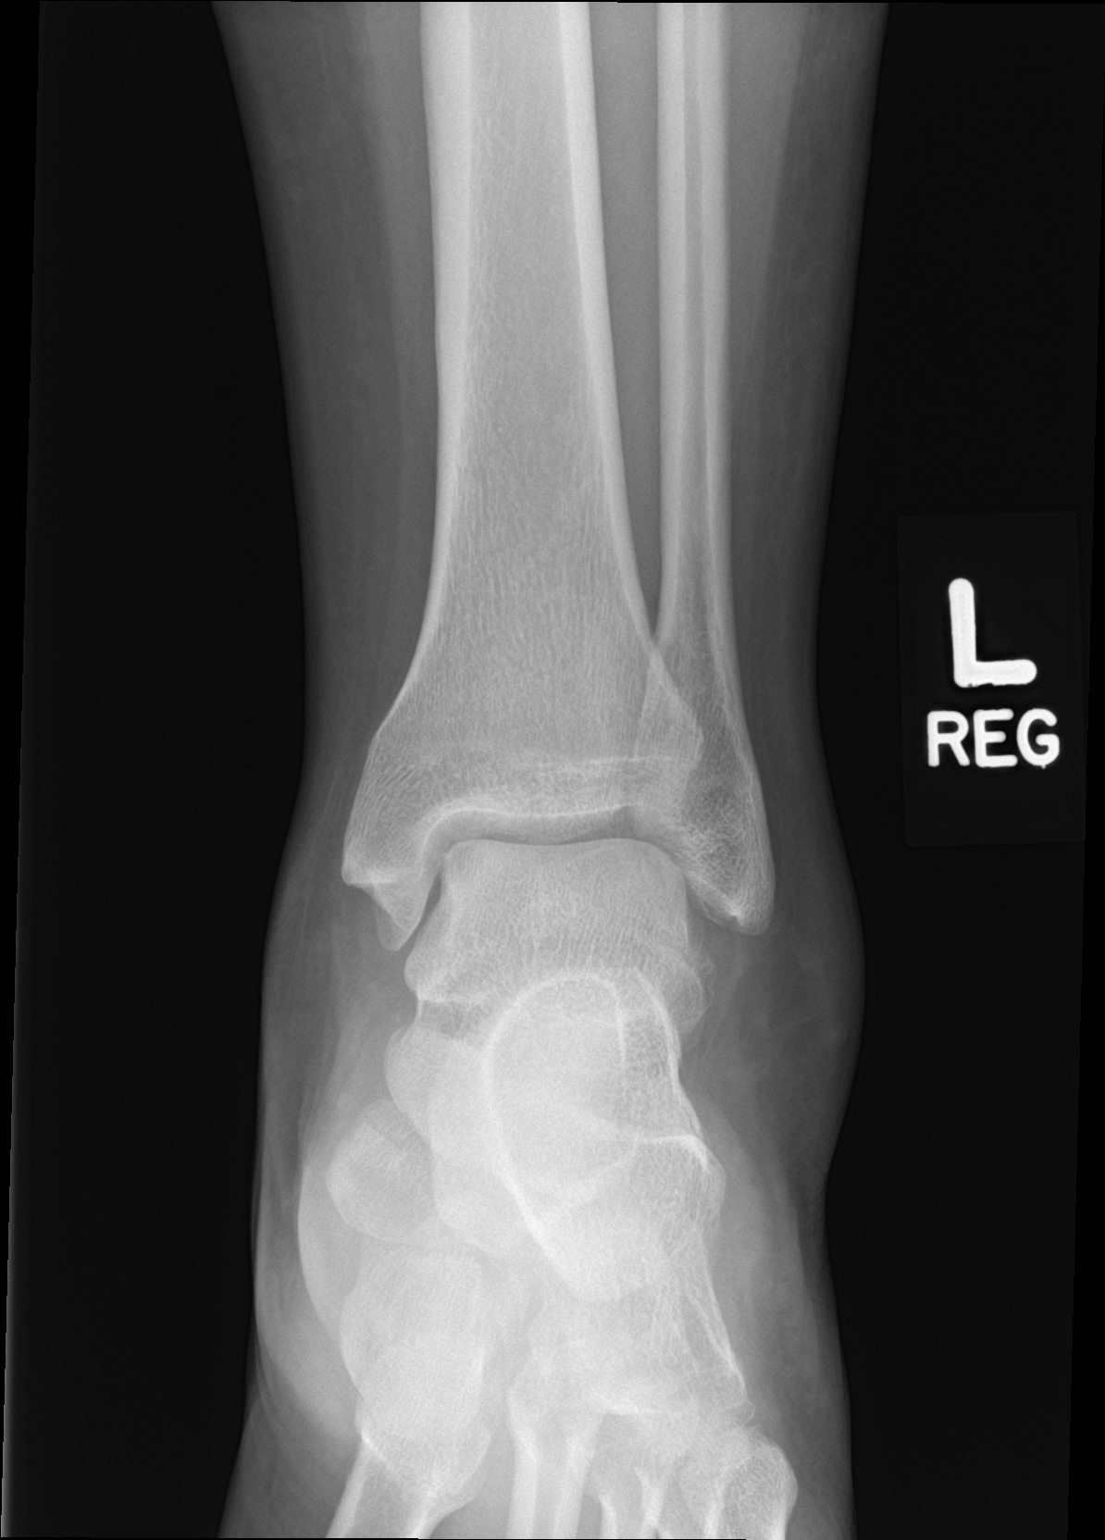

[ankle obl]
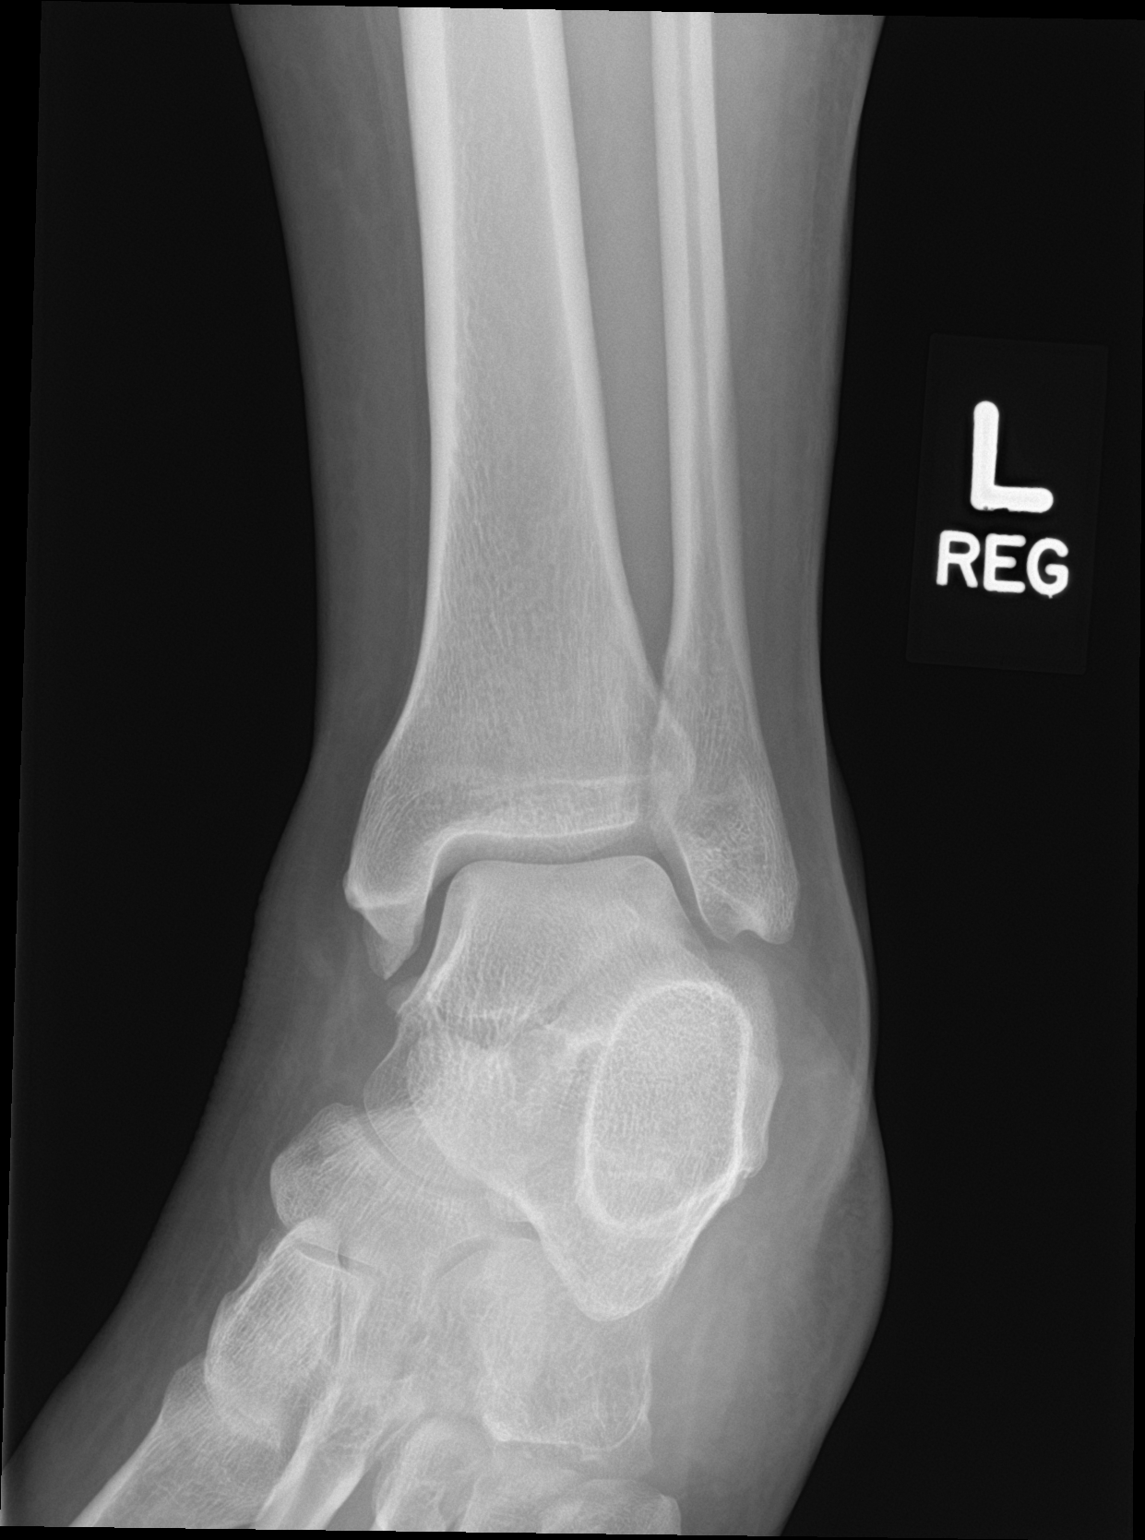

[ankle lat]
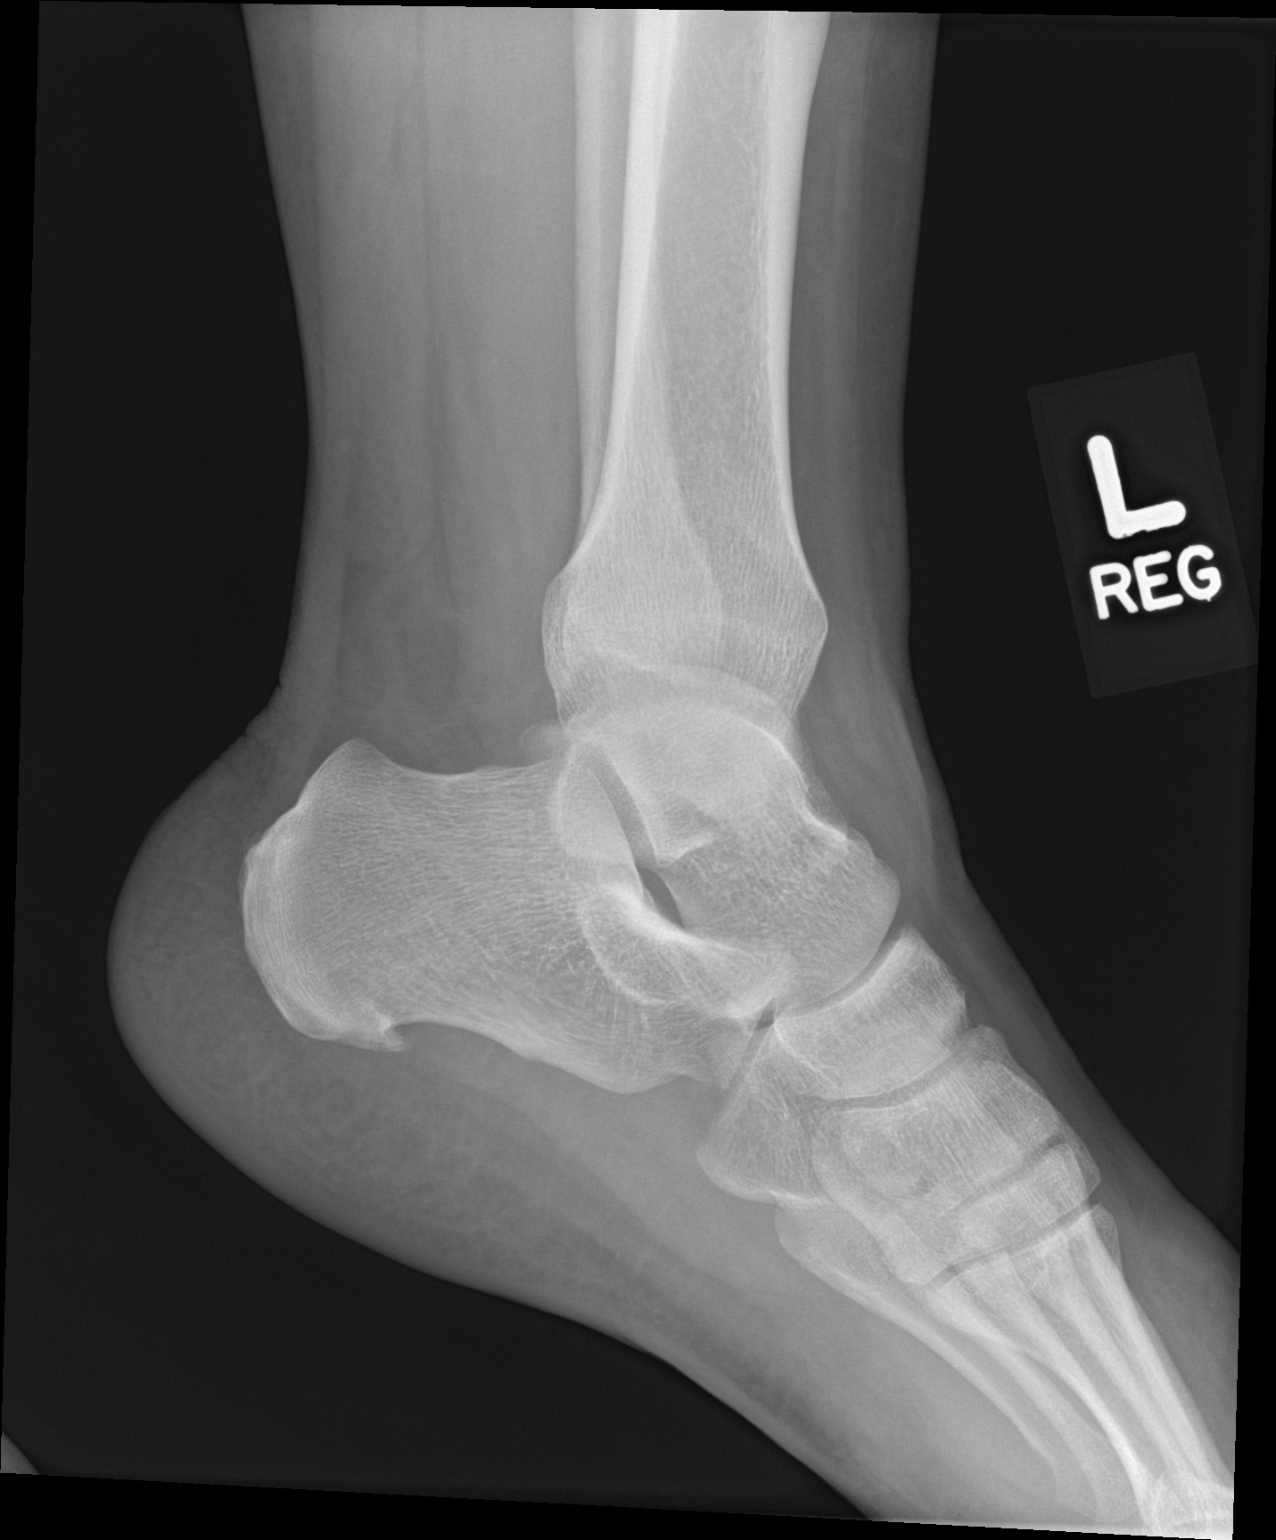

[3 of 3 positions shown; findings below may reference images not displayed]

FINDINGS: Soft tissue swelling, particularly laterally. The ankle mortise is
intact. No fractures are identified.
IMPRESSION: Soft-tissue swelling with no identified fracture.

## 2016-08-05 NOTE — ED Provider Notes (Signed)
[place] [facility] Provider Note   CSN: [id number] Arrival date & time: 08/05/16  1008     History   Chief Complaint Chief Complaint  Patient presents with  . Ankle Pain    HPI Angela Ayers is a 36 y.o. female.  HPI   36 year old female in no static and medical history presents with concern for 2 weeks of left ankle pain after she had slipped on a wet floor. She's been using ice, ibuprofen and elevation without relief. Reports she still continuing to have pain and swelling.  History reviewed. No pertinent past medical history.  There are no active problems to display for this patient.   Past Surgical History:  Procedure Laterality Date  . APPENDECTOMY      OB History    No data available       Home Medications    Prior to Admission medications   Medication Sig Start Date End Date Taking? Authorizing Provider  clindamycin (CLEOCIN) 300 MG capsule Take 1 capsule (300 mg total) by mouth 4 (four) times daily. X 7 days 12/14/15   Julianne Rice, MD  ibuprofen (ADVIL,MOTRIN) 600 MG tablet Take 1 tablet (600 mg total) by mouth every 6 (six) hours as needed. 12/14/15   Julianne Rice, MD  predniSONE (DELTASONE) 10 MG tablet Take 2 tablets (20 mg total) by mouth daily. 12/14/15   Julianne Rice, MD    Family History No family history on file.  Social History Social History  Substance Use Topics  . Smoking status: Current Some Day Smoker    Packs/day: 0.50    Types: Cigarettes  . Smokeless tobacco: Never Used  . Alcohol use No     Allergies   Latex   Review of Systems Review of Systems  Constitutional: Negative for fever.  HENT: Negative for sore throat.   Eyes: Negative for visual disturbance.  Respiratory: Negative for cough and shortness of breath.   Cardiovascular: Negative for chest pain.  Gastrointestinal: Negative for abdominal pain.  Genitourinary: Negative for difficulty urinating.  Musculoskeletal: Positive for arthralgias and gait  problem (pain with ambulating). Negative for back pain and neck pain.  Skin: Negative for rash.  Neurological: Negative for syncope and headaches.     Physical Exam Updated Vital Signs BP (!) 138/98 (BP Location: Left Arm)   Pulse 88   Temp 98.9 F (37.2 C) (Oral)   Resp 16   Ht 5\' 5"  (1.651 m)   Wt 273 lb (123.8 kg)   LMP 07/28/2016   SpO2 98%   BMI 45.43 kg/m   Physical Exam  Constitutional: She is oriented to person, place, and time. She appears well-developed and well-nourished. No distress.  HENT:  Head: Normocephalic and atraumatic.  Eyes: Conjunctivae and EOM are normal.  Neck: Normal range of motion.  Cardiovascular: Normal rate, regular rhythm and intact distal pulses.   Pulmonary/Chest: Effort normal. No respiratory distress.  Abdominal: There is no tenderness.  Musculoskeletal: She exhibits no edema.       Left ankle: She exhibits normal range of motion. Tenderness. Lateral malleolus and AITFL tenderness found. No posterior TFL, no head of 5th metatarsal and no proximal fibula tenderness found.  Neurological: She is alert and oriented to person, place, and time.  Skin: Skin is warm and dry. No rash noted. She is not diaphoretic. No erythema.  Nursing note and vitals reviewed.    ED Treatments / Results  Labs (all labs ordered are listed, but only abnormal results are displayed) Labs Reviewed -  No data to display  EKG  EKG Interpretation None       Radiology Dg Ankle Complete Left  Result Date: 08/05/2016 CLINICAL DATA:  Pain after trauma. EXAM: LEFT ANKLE COMPLETE - 3+ VIEW COMPARISON:  None. FINDINGS: Soft tissue swelling, particularly laterally. The ankle mortise is intact. No fractures are identified. IMPRESSION: Soft-tissue swelling with no identified fracture. Electronically Signed   By: Dorise Bullion III M.D   On: 08/05/2016 10:54    Procedures Procedures (including critical care time)  Medications Ordered in ED Medications - No data to  display   Initial Impression / Assessment and Plan / ED Course  I have reviewed the triage vital signs and the nursing notes.  Pertinent labs & imaging results that were available during my care of the patient were reviewed by me and considered in my medical decision making (see chart for details).     36yo female presents with concern for fall 2 weeks ago with left ankle pain. XR shows no sign of fracture. Patient likely with severe ankle sprain.  Aircast splint placed, crutches given, recommend weight bearing as tolerated, continued ice, elevation, ibuprofen and tylenol.  Final Clinical Impressions(s) / ED Diagnoses   Final diagnoses:  Sprain of anterior talofibular ligament of left ankle, initial encounter    New Prescriptions Discharge Medication List as of 08/05/2016 11:02 AM       Gareth Morgan, MD 08/05/16 2220

## 2016-08-05 NOTE — Discharge Instructions (Signed)
I recommend taking tylenol 1000mg  four times daily and ibuprofen 400mg  6 times daily for pain.

## 2016-08-05 NOTE — ED Triage Notes (Signed)
Pt states she twisted her L ankle 2 weeks ago when she slipped on a wet floor. Pt has been using ice, ibuprofen, and elevation but states pain and swelling still persists.

## 2019-12-30 ENCOUNTER — Emergency Department (HOSPITAL_BASED_OUTPATIENT_CLINIC_OR_DEPARTMENT_OTHER)
Admission: EM | Admit: 2019-12-30 | Discharge: 2019-12-30 | Disposition: A | Discharge status: Home / Self Care | Payer: Medicare Other | Attending: Emergency Medicine | Admitting: Emergency Medicine

## 2019-12-30 ENCOUNTER — Encounter (HOSPITAL_BASED_OUTPATIENT_CLINIC_OR_DEPARTMENT_OTHER): Payer: Self-pay | Admitting: Emergency Medicine

## 2019-12-30 ENCOUNTER — Other Ambulatory Visit: Payer: Self-pay

## 2019-12-30 ENCOUNTER — Emergency Department (HOSPITAL_BASED_OUTPATIENT_CLINIC_OR_DEPARTMENT_OTHER): Payer: Medicare Other

## 2019-12-30 DIAGNOSIS — R519 Headache, unspecified: Secondary | ICD-10-CM

## 2019-12-30 DIAGNOSIS — D164 Benign neoplasm of bones of skull and face: Secondary | ICD-10-CM | POA: Insufficient documentation

## 2019-12-30 DIAGNOSIS — Z91040 Latex allergy status: Secondary | ICD-10-CM | POA: Diagnosis not present

## 2019-12-30 DIAGNOSIS — F17210 Nicotine dependence, cigarettes, uncomplicated: Secondary | ICD-10-CM | POA: Diagnosis not present

## 2019-12-30 DIAGNOSIS — D169 Benign neoplasm of bone and articular cartilage, unspecified: Secondary | ICD-10-CM

## 2019-12-30 DIAGNOSIS — H53149 Visual discomfort, unspecified: Secondary | ICD-10-CM | POA: Insufficient documentation

## 2019-12-30 HISTORY — DX: Unspecified asthma, uncomplicated: J45.909

## 2019-12-30 LAB — CBC WITH DIFFERENTIAL/PLATELET
Abs Immature Granulocytes: 0.01 10*3/uL (ref 0.00–0.07)
Basophils Absolute: 0.1 10*3/uL (ref 0.0–0.1)
Basophils Relative: 1 %
Eosinophils Absolute: 0.3 10*3/uL (ref 0.0–0.5)
Eosinophils Relative: 5 %
HCT: 37.2 % (ref 36.0–46.0)
Hemoglobin: 12.1 g/dL (ref 12.0–15.0)
Immature Granulocytes: 0 %
Lymphocytes Relative: 46 %
Lymphs Abs: 2.6 10*3/uL (ref 0.7–4.0)
MCH: 27.4 pg (ref 26.0–34.0)
MCHC: 32.5 g/dL (ref 30.0–36.0)
MCV: 84.2 fL (ref 80.0–100.0)
Monocytes Absolute: 0.5 10*3/uL (ref 0.1–1.0)
Monocytes Relative: 8 %
Neutro Abs: 2.2 10*3/uL (ref 1.7–7.7)
Neutrophils Relative %: 40 %
Platelets: 410 10*3/uL — ABNORMAL HIGH (ref 150–400)
RBC: 4.42 MIL/uL (ref 3.87–5.11)
RDW: 14.9 % (ref 11.5–15.5)
WBC: 5.6 10*3/uL (ref 4.0–10.5)
nRBC: 0 % (ref 0.0–0.2)

## 2019-12-30 LAB — BASIC METABOLIC PANEL
Anion gap: 11 (ref 5–15)
BUN: 14 mg/dL (ref 6–20)
CO2: 22 mmol/L (ref 22–32)
Calcium: 9.2 mg/dL (ref 8.9–10.3)
Chloride: 104 mmol/L (ref 98–111)
Creatinine, Ser: 0.63 mg/dL (ref 0.44–1.00)
GFR calc non Af Amer: >60 mL/min (ref >60)
Glucose, Bld: 106 mg/dL — ABNORMAL HIGH (ref 70–99)
Potassium: 4.1 mmol/L (ref 3.5–5.1)
Sodium: 137 mmol/L (ref 135–145)

## 2019-12-30 IMAGING — CT CT HEAD WO/W CM
4 of 8 series · 16 of 47 positions shown, 18 images · IV contrast (omnipaque)
Comparison: None.

CLINICAL DATA: Lump on left side of head, vision changes

EXAM:
CT HEAD WITHOUT AND WITH CONTRAST
TECHNIQUE: Contiguous axial images were obtained from the base of the skull
through the vertex without and with intravenous contrast
CONTRAST:  75mL OMNIPAQUE IOHEXOL 300 MG/ML  SOLN

[Series 2: head wo · axial · 0.42mm/px · z∈[-184,-74]mm · 5 of 34 slices shown]
[im 6/34  brain]
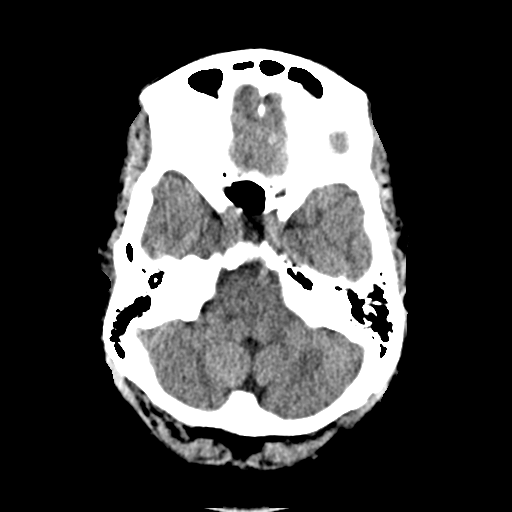
[im 12/34  brain]
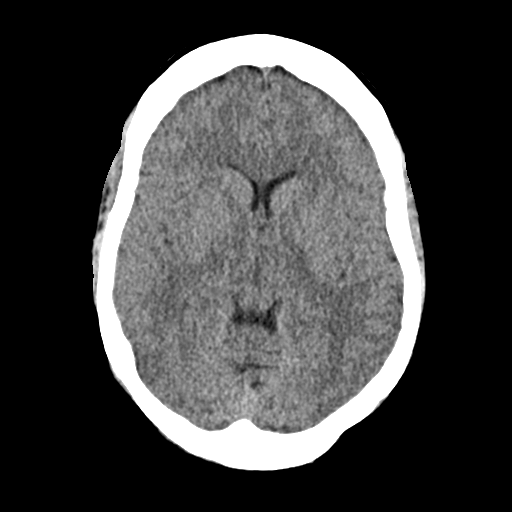
[im 17/34  brain]
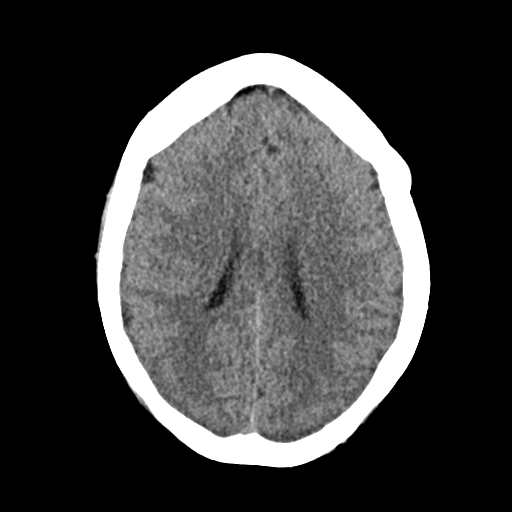
[im 23/34  brain]
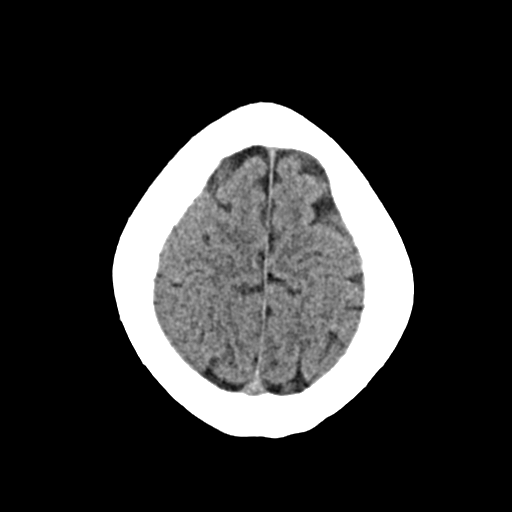
[im 28/34  brain]
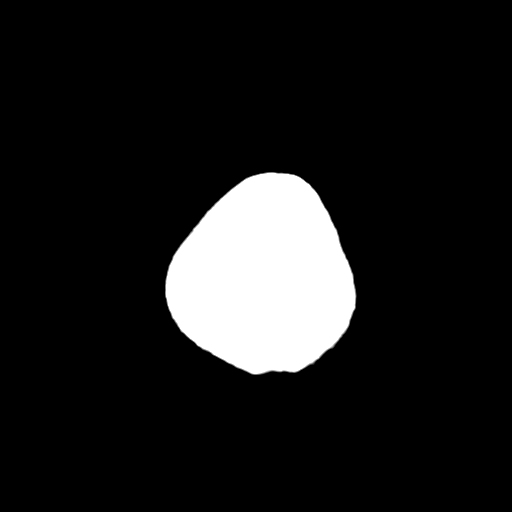

[Series 4: cor wo soft · coronal · 0.34mm/px · 3 of 68 slices shown]
[im 17/68  brain]
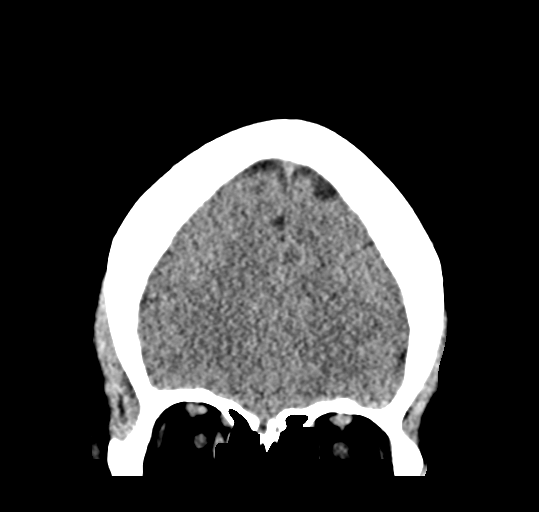
[im 34/68  brain]
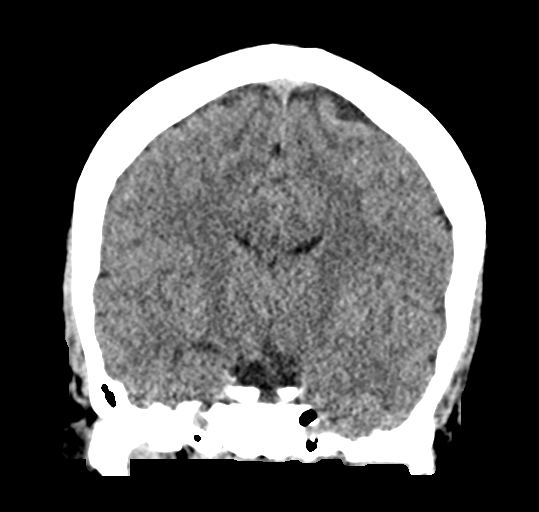
[im 51/68  brain]
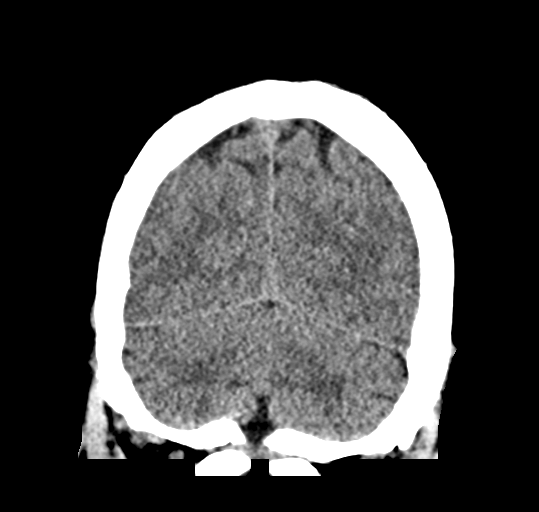

[Series 5: sag wo soft · sagittal · 0.33mm/px · 2 of 58 slices shown]
[im 20/58  brain]
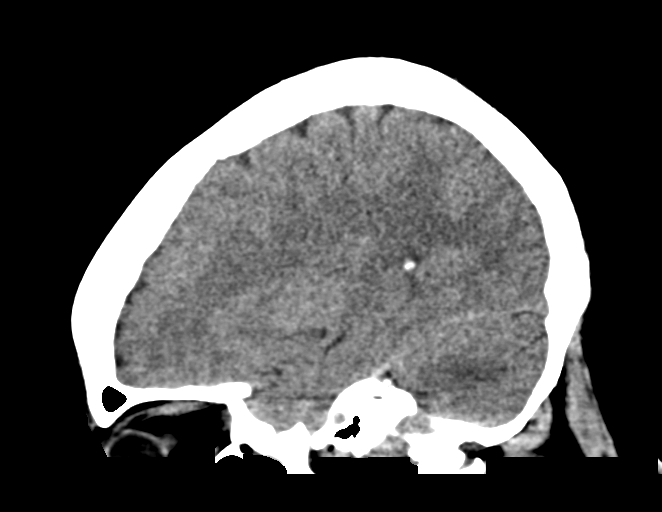
[im 39/58  brain]
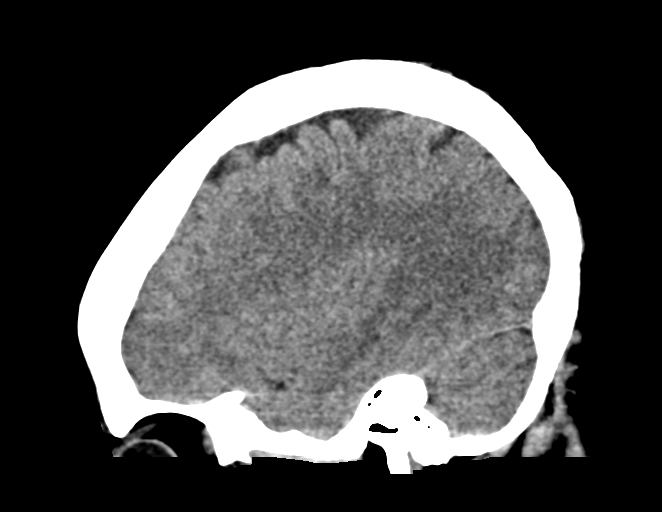

[Series 6: head w soft · axial · 0.42mm/px · z∈[-189,-69]mm · 6 of 34 slices shown, 8 images]
[im 5/34  brain]
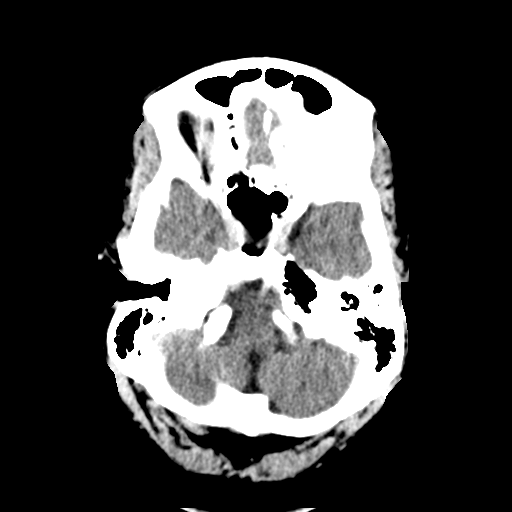
[im 5/34  bone]
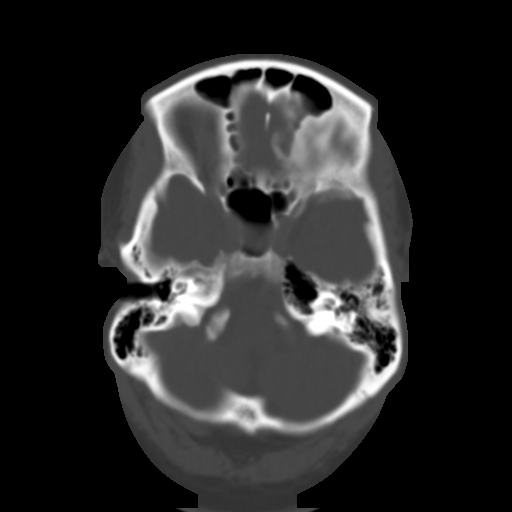
[im 10/34  brain]
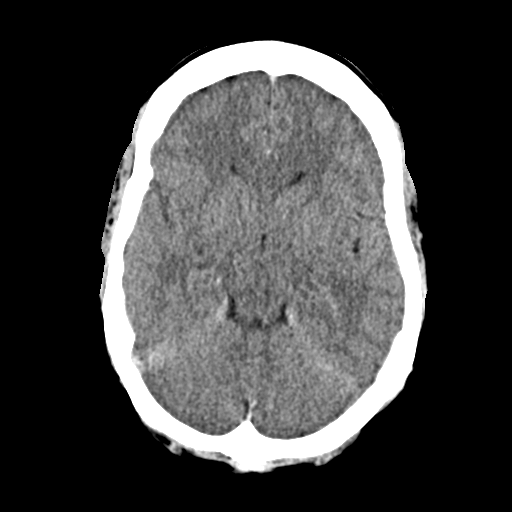
[im 15/34  brain]
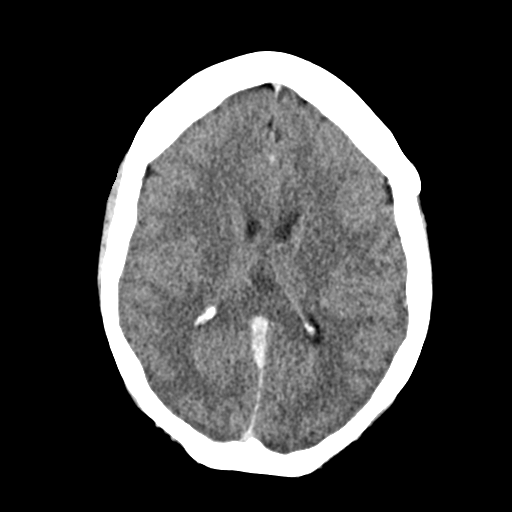
[im 19/34  brain]
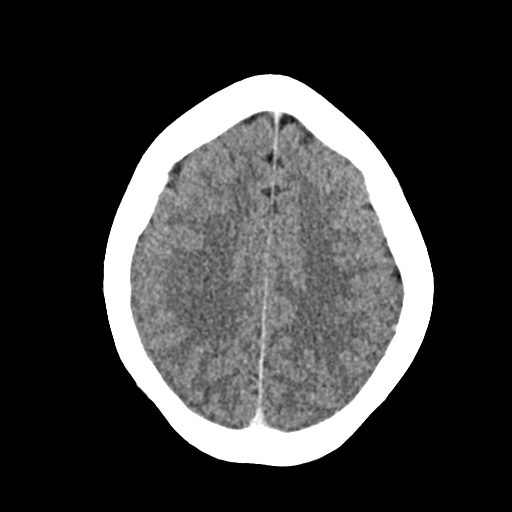
[im 24/34  brain]
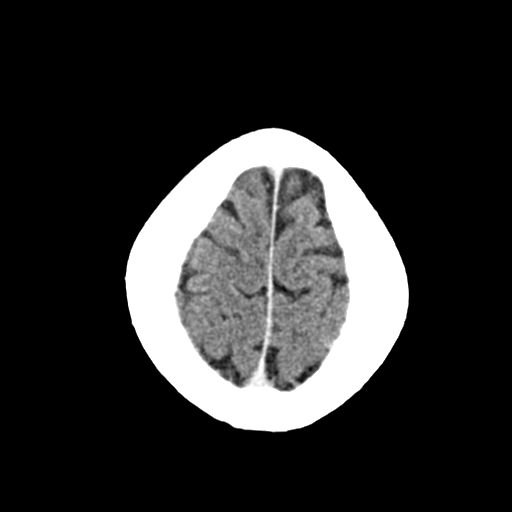
[im 24/34  bone]
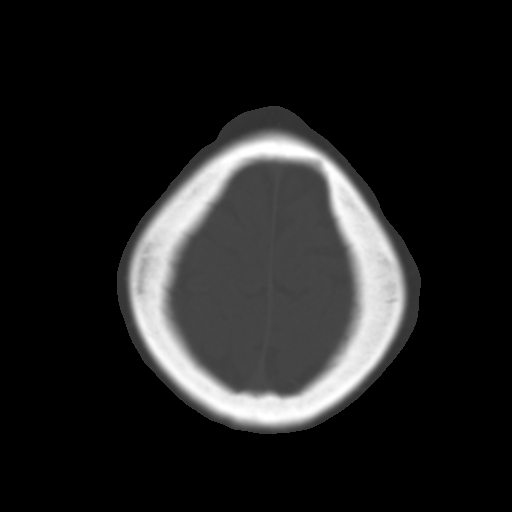
[im 29/34  brain]
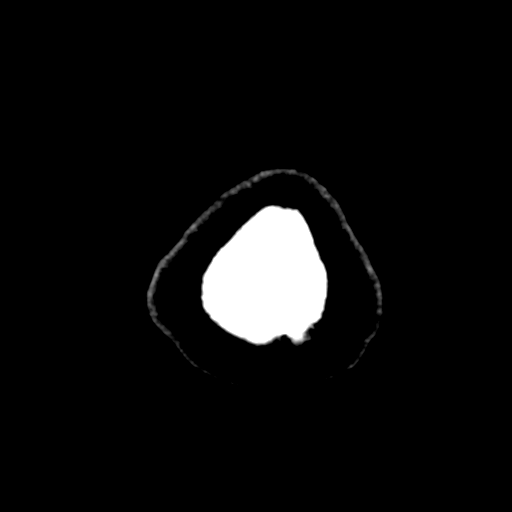

[16 of 47 positions shown; findings below may reference images not displayed]

FINDINGS: Brain: There is no acute intracranial hemorrhage, mass effect, or
edema. Gray-white differentiation is preserved. There is no
extra-axial fluid collection. Ventricles and sulci are within normal
limits in size and configuration. Incidental note is made of a
partially empty sella

Vascular: No hyperdense vessel or unexpected calcification.

Skull: There is focal hyperostosis along the outer table of the left
frontal calvarium along the coronal suture. Otherwise unremarkable.

Sinuses/Orbits: No acute finding.

Other: None.
IMPRESSION: No acute abnormality.

Focal hyperostosis along the outer table of the left frontal
calvarium probably reflects an incidental small osteoma. Unclear if
this reflects palpable lump.

## 2019-12-30 MED ORDER — KETOROLAC TROMETHAMINE 60 MG/2ML IM SOLN: 60.0000 mg | Freq: Once | INTRAMUSCULAR | Status: DC

## 2019-12-30 MED ORDER — IOHEXOL 300 MG/ML  SOLN
100.0000 mL | Freq: Once | INTRAMUSCULAR | Status: AC | PRN
  Administered 2019-12-30: 75 mL via INTRAVENOUS

## 2019-12-30 MED ORDER — KETOROLAC TROMETHAMINE 10 MG PO TABS: 10.0000 mg | ORAL_TABLET | Freq: Four times a day (QID) | ORAL | 0 refills | Status: DC | PRN

## 2019-12-30 MED ORDER — KETOROLAC TROMETHAMINE 30 MG/ML IJ SOLN
30.0000 mg | Freq: Once | INTRAMUSCULAR | Status: AC
  Administered 2019-12-30: 30 mg via INTRAVENOUS
  Filled 2019-12-30: qty 1

## 2019-12-30 NOTE — ED Provider Notes (Signed)
[place] EMERGENCY DEPARTMENT Provider Note   CSN: [id number] Arrival date & time: 12/30/19  0901     History Chief Complaint  Patient presents with  . Knot on head    Angela Ayers is a 39 y.o. female with no relevant past medical history presents the ED with complaints of painful knot on the left side of her head at the junction of hairline and forehead x3 days.  She also endorses some mild left-sided photophobia/visual deficits.  She denies any constantly blurred vision or overt vision loss.  Denies any history of migraine headaches.  Patient also denies any precipitating trauma, fevers, chills, chest pain or shortness of breath, neck discomfort/meningismus, nausea or vomiting, new spinning dizziness, numbness or weakness, ataxia or difficulty walking, or other symptoms.  HPI     Past Medical History:  Diagnosis Date  . Asthma     There are no problems to display for this patient.   Past Surgical History:  Procedure Laterality Date  . APPENDECTOMY    . CESAREAN SECTION       OB History   No obstetric history on file.     No family history on file.  Social History   Tobacco Use  . Smoking status: Current Some Day Smoker    Packs/day: 0.50    Types: Cigarettes  . Smokeless tobacco: Never Used  Substance Use Topics  . Alcohol use: No  . Drug use: No    Home Medications Prior to Admission medications   Medication Sig Start Date End Date Taking? Authorizing Provider  ibuprofen (ADVIL,MOTRIN) 600 MG tablet Take 1 tablet (600 mg total) by mouth every 6 (six) hours as needed. 12/14/15   Julianne Rice, MD  ketorolac (TORADOL) 10 MG tablet Take 1 tablet (10 mg total) by mouth every 6 (six) hours as needed for moderate pain. 12/30/19   Corena Herter, PA-C    Allergies    Latex  Review of Systems   Review of Systems  All other systems reviewed and are negative.   Physical Exam Updated Vital Signs BP (!) 157/103 (BP Location: Right Arm)    Pulse 98   Temp 97.7 F (36.5 C) (Oral)   Resp 20   Ht 5\' 5"  (1.651 m)   Wt 131.5 kg   LMP 12/27/2019   SpO2 100%   BMI 48.26 kg/m   Physical Exam Vitals and nursing note reviewed. Exam conducted with a chaperone present.  Constitutional:      Appearance: Normal appearance.  HENT:     Head: Normocephalic and atraumatic.     Comments: Mild area of swelling focally in the left frontal region of head at junction of hairline.  No overlying erythema or warmth.  No induration.  No weeping.  TTP. Eyes:     General: No scleral icterus.    Extraocular Movements: Extraocular movements intact.     Conjunctiva/sclera: Conjunctivae normal.     Pupils: Pupils are equal, round, and reactive to light.  Neck:     Comments: No meningismus or midline cervical tenderness to palpation. Cardiovascular:     Rate and Rhythm: Normal rate and regular rhythm.     Pulses: Normal pulses.     Heart sounds: Normal heart sounds.  Pulmonary:     Effort: Pulmonary effort is normal. No respiratory distress.     Breath sounds: Normal breath sounds.  Musculoskeletal:     Cervical back: Normal range of motion and neck supple. No rigidity.  Skin:  General: Skin is dry.     Capillary Refill: Capillary refill takes less than 2 seconds.  Neurological:     General: No focal deficit present.     Mental Status: She is alert and oriented to person, place, and time.     GCS: GCS eye subscore is 4. GCS verbal subscore is 5. GCS motor subscore is 6.     Cranial Nerves: No cranial nerve deficit.     Sensory: No sensory deficit.     Motor: No weakness.     Coordination: Coordination normal.     Gait: Gait normal.  Psychiatric:        Mood and Affect: Mood normal.        Behavior: Behavior normal.        Thought Content: Thought content normal.     ED Results / Procedures / Treatments   Labs (all labs ordered are listed, but only abnormal results are displayed) Labs Reviewed  CBC WITH DIFFERENTIAL/PLATELET -  Abnormal; Notable for the following components:      Result Value   Platelets 410 (*)    All other components within normal limits  BASIC METABOLIC PANEL - Abnormal; Notable for the following components:   Glucose, Bld 106 (*)    All other components within normal limits    EKG None  Radiology CT Head W or Wo Contrast  Result Date: 12/30/2019 CLINICAL DATA:  Lump on left side of head, vision changes EXAM: CT HEAD WITHOUT AND WITH CONTRAST TECHNIQUE: Contiguous axial images were obtained from the base of the skull through the vertex without and with intravenous contrast CONTRAST:  44mL OMNIPAQUE IOHEXOL 300 MG/ML  SOLN COMPARISON:  None. FINDINGS: Brain: There is no acute intracranial hemorrhage, mass effect, or edema. Gray-white differentiation is preserved. There is no extra-axial fluid collection. Ventricles and sulci are within normal limits in size and configuration. Incidental note is made of a partially empty sella Vascular: No hyperdense vessel or unexpected calcification. Skull: There is focal hyperostosis along the outer table of the left frontal calvarium along the coronal suture. Otherwise unremarkable. Sinuses/Orbits: No acute finding. Other: None. IMPRESSION: No acute abnormality. 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 Unclear if this reflects palpable lump. Electronically Signed   By: Macy Mis M.D.   On: 12/30/2019 15:36    Procedures Procedures (including critical care time)  Medications Ordered in ED Medications  iohexol (OMNIPAQUE) 300 MG/ML solution 100 mL (75 mLs Intravenous Contrast Given 12/30/19 1448)  ketorolac (TORADOL) 30 MG/ML injection 30 mg (30 mg Intravenous Given 12/30/19 1515)    ED Course  I have reviewed the triage vital signs and the nursing notes.  Pertinent labs & imaging results that were available during my care of the patient were reviewed by me and considered in my  medical decision making (see chart for details).  Clinical Course as of Dec 30 1635  Wed Dec 30, 2019  1356 Spoke with neuroradiologist who recommended CT with and without contrast to evaluate for complicated frontal sinusitis versus infected sebaceous cyst versus odd vascular abnormality.     [GG]    Clinical Course User Index [GG] Corena Herter, PA-C   MDM Rules/Calculators/A&P                          Spoke with neuroradiologist who recommended CT with and without contrast to evaluate for complicated frontal sinusitis  versus infected sebaceous cyst versus odd vascular abnormality.    CT head without contrast is personally reviewed and demonstrates no acute or emergent pathology.  There is focal hyperostosis along the outer table of left frontal calvarium concerning for small osteoma.  These are typically benign.  Will have patient follow-up with her primary care provider regarding these findings.  On subsequent evaluation, patient felt much improved after IV Toradol.  We will send her home with p.o. ketorolac and Tylenol as needed for headache symptoms.  She denies any focal deficits and her neurologic exam was entirely reassuring.  Suspect that this may be migraine (left side) with incidental finding of osteoma.  She is getting picked up by her cousin.  She is reassured by today's work-up and plans to follow-up with her primary care provider.  All of the evaluation and work-up results were discussed with the patient and any family at bedside.  Patient and/or family were informed that while patient is appropriate for discharge at this time, some medical emergencies may only develop or become detectable after a period of time.  I specifically instructed patient and/or family to return to return to the ED or seek immediate medical attention for any new or worsening symptoms.  They were provided opportunity to ask any additional questions and have none at this time.  Prior to discharge patient  is feeling well, agreeable with plan for discharge home.  They have expressed understanding of verbal discharge instructions as well as return precautions and are agreeable to the plan.    Final Clinical Impression(s) / ED Diagnoses Final diagnoses:  Acute nonintractable headache, unspecified headache type  Osteoma    Rx / DC Orders ED Discharge Orders         Ordered    ketorolac (TORADOL) 10 MG tablet  Every 6 hours PRN        12/30/19 1637           Corena Herter, PA-C 12/30/19 1638    Tegeler, Gwenyth Allegra, MD 12/31/19 [phone]

## 2019-12-30 NOTE — ED Notes (Signed)
Patient transported to CT 

## 2019-12-30 NOTE — ED Triage Notes (Signed)
Pt reports a painful knot to L side of her head with photophobia x 3 days.

## 2019-12-30 NOTE — Discharge Instructions (Addendum)
Your laboratory work-up and imaging obtained here today was reassuring.  I am glad that your symptoms improved with Toradol IV here in the ED.  Please take your prescribed medications, as directed.  Do not combine ketorolac with other NSAIDs.  You may also take Tylenol for additional pain relief.    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  This may be contributing to your symptoms.  Please follow-up with your primary care provider regarding these findings.  They are typically benign.  Return to the ER seek immediate medical attention should experience any new or worsening symptoms.

## 2021-06-12 ENCOUNTER — Emergency Department (HOSPITAL_COMMUNITY): Payer: Medicare Other | Admitting: Anesthesiology

## 2021-06-12 ENCOUNTER — Observation Stay (HOSPITAL_BASED_OUTPATIENT_CLINIC_OR_DEPARTMENT_OTHER)
Admission: EM | Admit: 2021-06-12 | Discharge: 2021-06-13 | Disposition: A | Discharge status: Home / Self Care | Payer: Medicare Other | Attending: General Surgery | Admitting: General Surgery

## 2021-06-12 ENCOUNTER — Emergency Department (HOSPITAL_BASED_OUTPATIENT_CLINIC_OR_DEPARTMENT_OTHER): Payer: Medicare Other

## 2021-06-12 ENCOUNTER — Encounter (HOSPITAL_BASED_OUTPATIENT_CLINIC_OR_DEPARTMENT_OTHER): Payer: Self-pay

## 2021-06-12 ENCOUNTER — Emergency Department (HOSPITAL_BASED_OUTPATIENT_CLINIC_OR_DEPARTMENT_OTHER): Payer: Medicare Other | Admitting: Anesthesiology

## 2021-06-12 ENCOUNTER — Other Ambulatory Visit: Payer: Self-pay

## 2021-06-12 ENCOUNTER — Encounter (HOSPITAL_COMMUNITY)
Admission: EM | Disposition: A | Discharge status: Home / Self Care | Payer: Self-pay | Source: Home / Self Care | Attending: Emergency Medicine

## 2021-06-12 ENCOUNTER — Inpatient Hospital Stay: Admit: 2021-06-12 | Payer: Medicare Other | Admitting: General Surgery

## 2021-06-12 DIAGNOSIS — J45909 Unspecified asthma, uncomplicated: Secondary | ICD-10-CM | POA: Insufficient documentation

## 2021-06-12 DIAGNOSIS — K8000 Calculus of gallbladder with acute cholecystitis without obstruction: Principal | ICD-10-CM | POA: Insufficient documentation

## 2021-06-12 DIAGNOSIS — K819 Cholecystitis, unspecified: Secondary | ICD-10-CM

## 2021-06-12 DIAGNOSIS — R1013 Epigastric pain: Secondary | ICD-10-CM | POA: Diagnosis present

## 2021-06-12 DIAGNOSIS — F17210 Nicotine dependence, cigarettes, uncomplicated: Secondary | ICD-10-CM | POA: Diagnosis not present

## 2021-06-12 DIAGNOSIS — K810 Acute cholecystitis: Secondary | ICD-10-CM | POA: Diagnosis present

## 2021-06-12 DIAGNOSIS — Z91040 Latex allergy status: Secondary | ICD-10-CM | POA: Insufficient documentation

## 2021-06-12 HISTORY — PX: CHOLECYSTECTOMY: SHX55

## 2021-06-12 LAB — URINALYSIS, MICROSCOPIC (REFLEX)

## 2021-06-12 LAB — COMPREHENSIVE METABOLIC PANEL
ALT: 15 U/L (ref 0–44)
AST: 18 U/L (ref 15–41)
Albumin: 3.8 g/dL (ref 3.5–5.0)
Alkaline Phosphatase: 55 U/L (ref 38–126)
Anion gap: 8 (ref 5–15)
BUN: 15 mg/dL (ref 6–20)
CO2: 24 mmol/L (ref 22–32)
Calcium: 8.7 mg/dL — ABNORMAL LOW (ref 8.9–10.3)
Chloride: 107 mmol/L (ref 98–111)
Creatinine, Ser: 0.82 mg/dL (ref 0.44–1.00)
GFR, Estimated: >60 mL/min (ref >60)
Glucose, Bld: 129 mg/dL — ABNORMAL HIGH (ref 70–99)
Potassium: 4 mmol/L (ref 3.5–5.1)
Sodium: 139 mmol/L (ref 135–145)
Total Bilirubin: 0.4 mg/dL (ref 0.3–1.2)
Total Protein: 7.5 g/dL (ref 6.5–8.1)

## 2021-06-12 LAB — CBC
HCT: 32.9 % — ABNORMAL LOW (ref 36.0–46.0)
Hemoglobin: 10.6 g/dL — ABNORMAL LOW (ref 12.0–15.0)
MCH: 25 pg — ABNORMAL LOW (ref 26.0–34.0)
MCHC: 32.2 g/dL (ref 30.0–36.0)
MCV: 77.6 fL — ABNORMAL LOW (ref 80.0–100.0)
Platelets: 418 10*3/uL — ABNORMAL HIGH (ref 150–400)
RBC: 4.24 MIL/uL (ref 3.87–5.11)
RDW: 16.8 % — ABNORMAL HIGH (ref 11.5–15.5)
WBC: 7.4 10*3/uL (ref 4.0–10.5)
nRBC: 0 % (ref 0.0–0.2)

## 2021-06-12 LAB — URINALYSIS, ROUTINE W REFLEX MICROSCOPIC
Bilirubin Urine: NEGATIVE
Glucose, UA: NEGATIVE mg/dL
Ketones, ur: NEGATIVE mg/dL
Leukocytes,Ua: NEGATIVE
Nitrite: NEGATIVE
Protein, ur: NEGATIVE mg/dL
Specific Gravity, Urine: 1.015 (ref 1.005–1.030)
pH: 6 (ref 5.0–8.0)

## 2021-06-12 LAB — HCG, SERUM, QUALITATIVE: Preg, Serum: NEGATIVE

## 2021-06-12 LAB — LIPASE, BLOOD: Lipase: 27 U/L (ref 11–51)

## 2021-06-12 IMAGING — US US ABDOMEN LIMITED
1 series · 14 of 25 positions shown · non-contrast
Comparison: CT AP, [DATE].

CLINICAL DATA: Abdominal pain.  Nausea vomiting.

EXAM:
ULTRASOUND ABDOMEN LIMITED RIGHT UPPER QUADRANT

[Series 1: us abdomen limited · 14 of 48 slices shown]
[im 1/48]
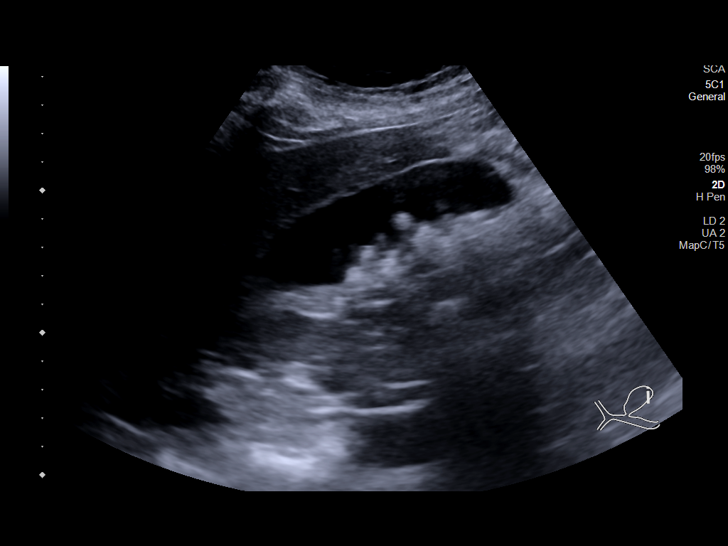
[im 4/48]
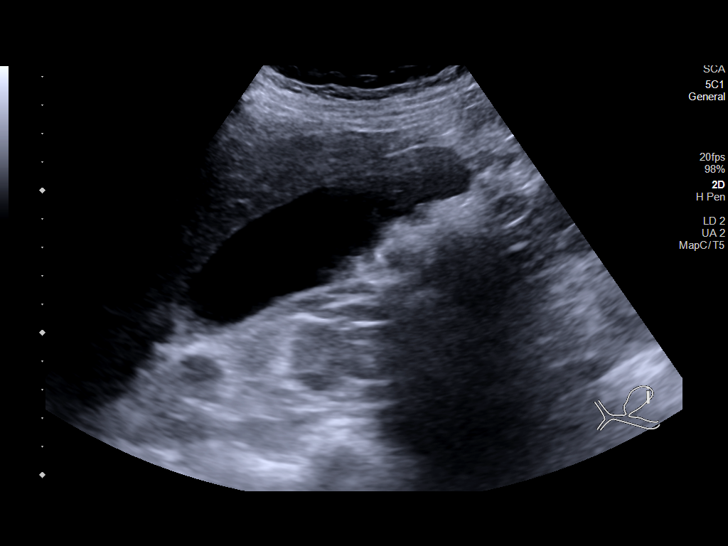
[im 8/48]
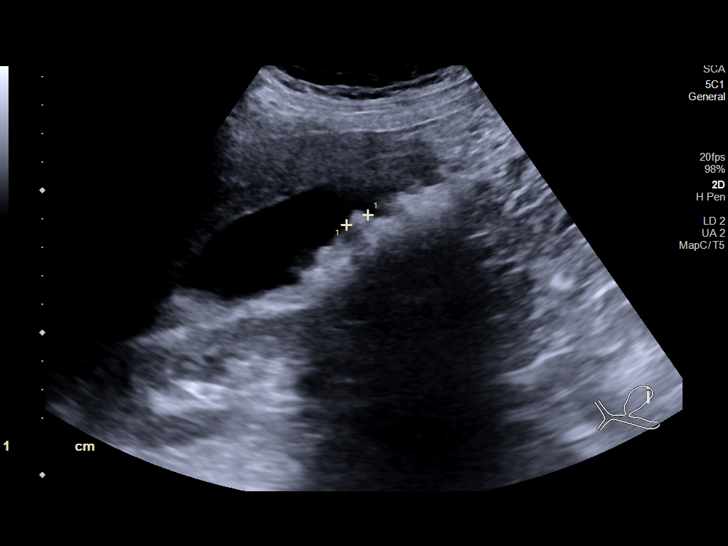
[im 12/48]
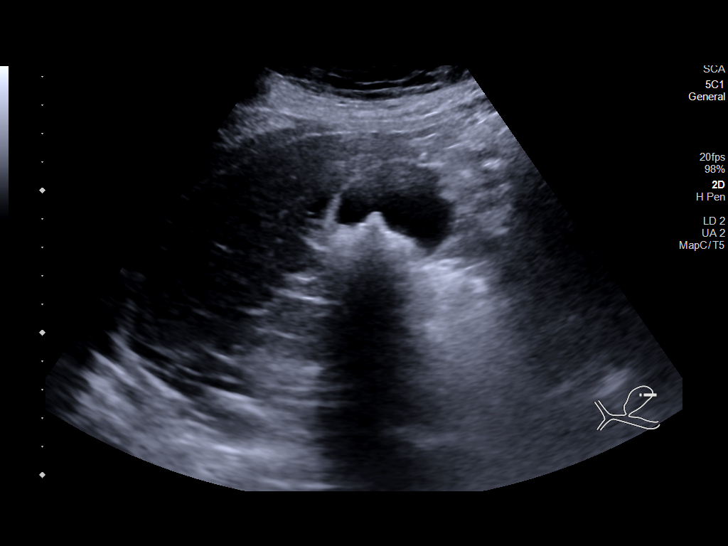
[im 16/48]
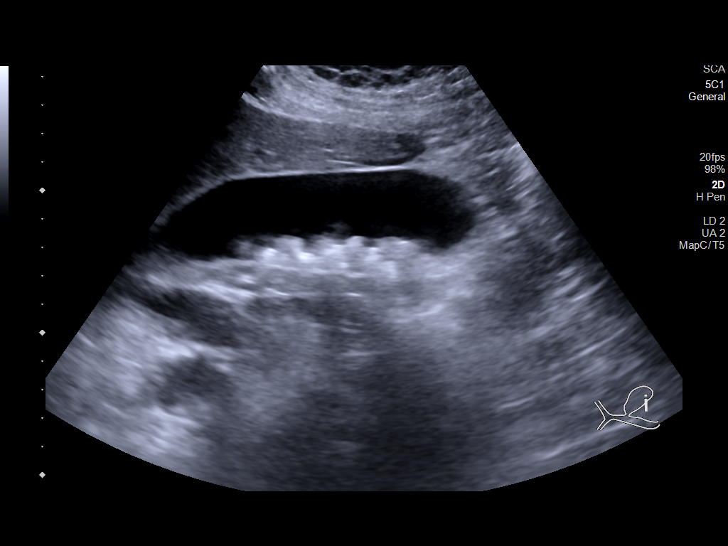
[im 18/48]
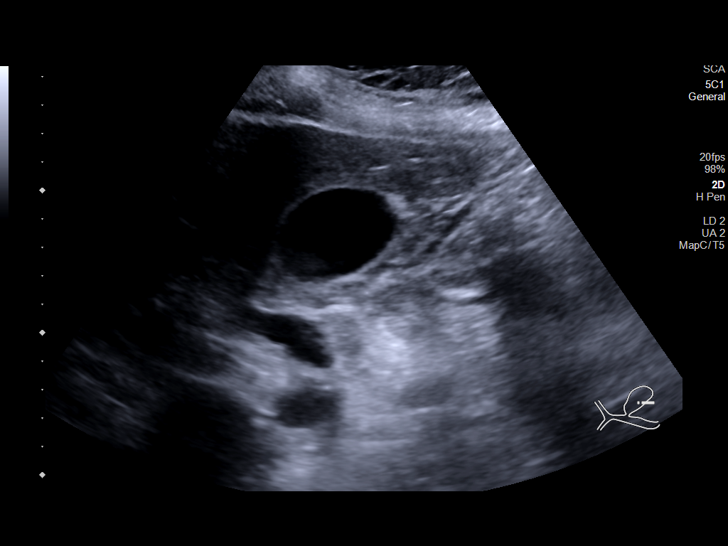
[im 22/48]
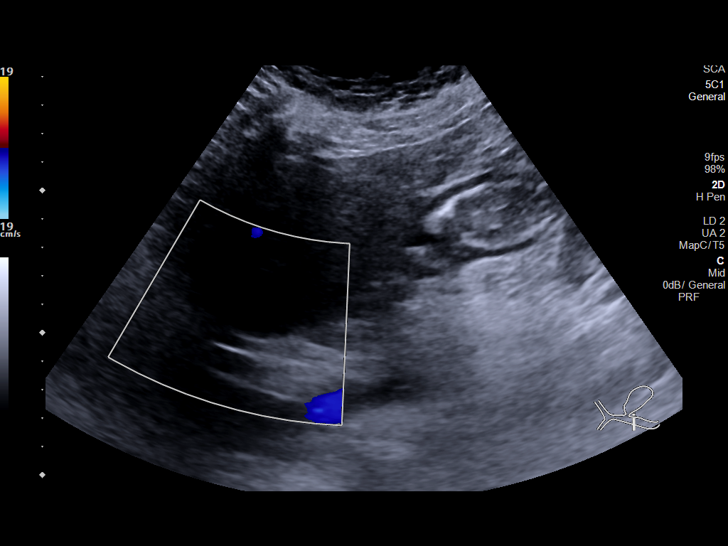
[im 26/48]
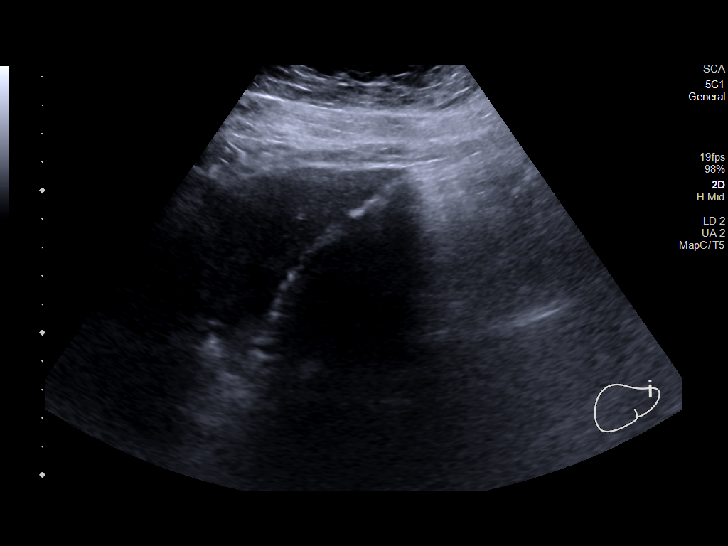
[im 30/48]
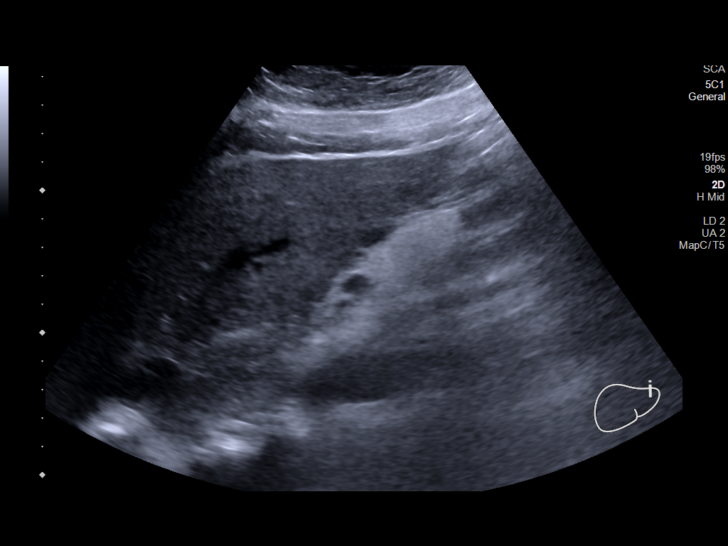
[im 32/48]
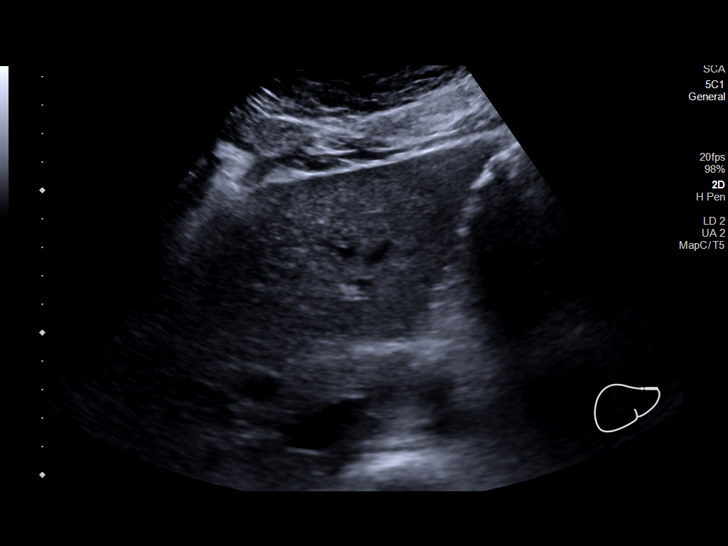
[im 36/48]
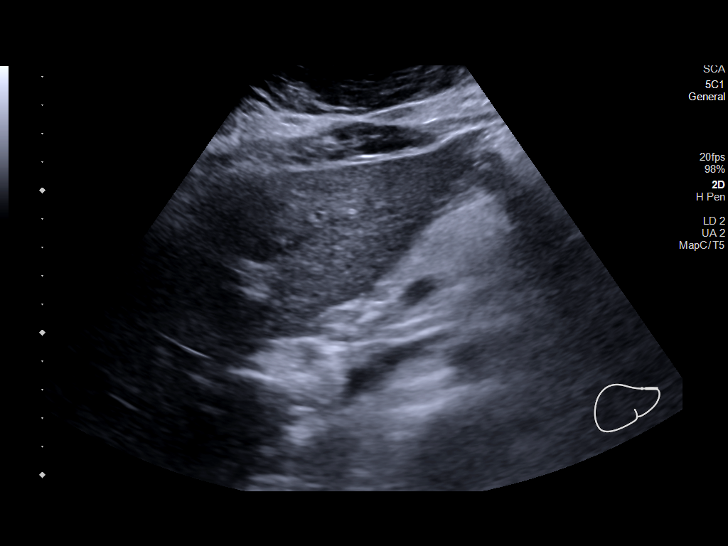
[im 40/48]
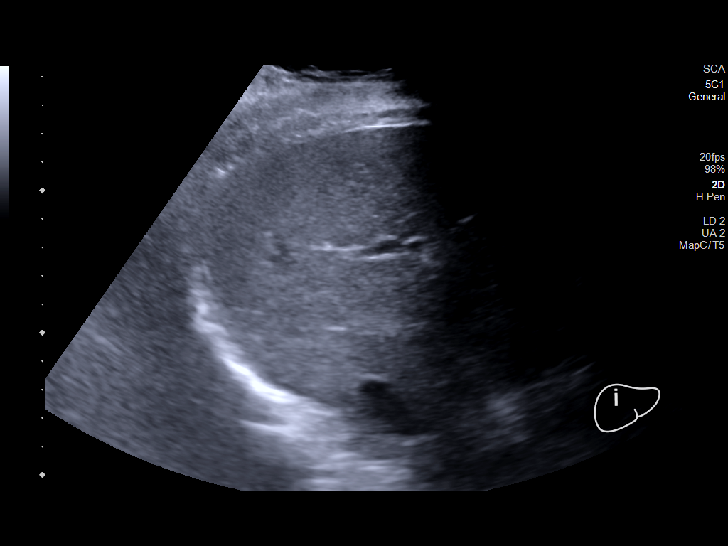
[im 44/48]
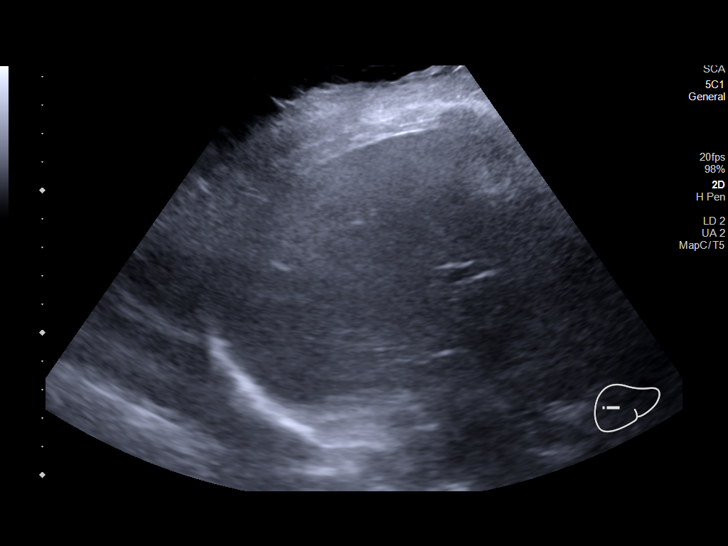
[im 48/48]
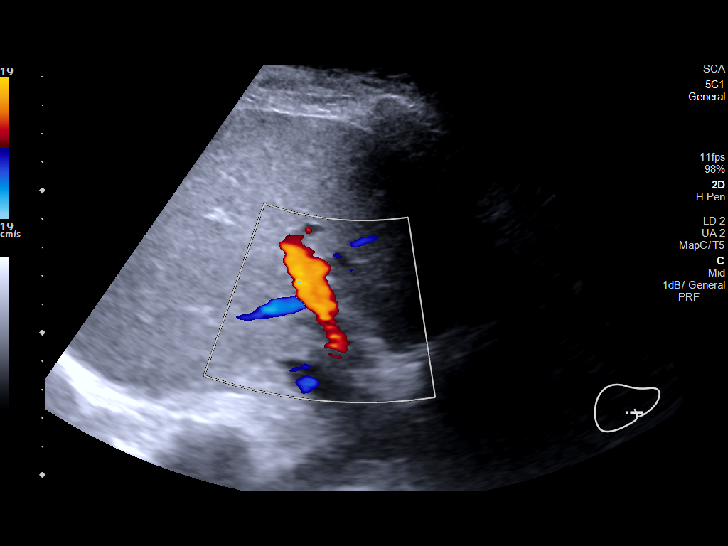

[14 of 25 positions shown; findings below may reference images not displayed]

FINDINGS: Gallbladder:

Multiple dependent and layering echogenic and shadowing gallstones
within a nondistended gallbladder. No wall thickening is visualized.
A POSITIVE sonographic Murphy sign was reported by the sonographer.

Common bile duct:

Diameter: 0.3 cm

Liver:

No focal lesion identified. Increased hepatic parenchymal
echogenicity. Portal vein is patent on color Doppler imaging with
normal direction of blood flow towards the liver.

Other: None.
IMPRESSION: 1. Cholelithiasis and POSITIVE reported sonographic Murphy's sign.
Findings suspicious for early acute calculus cholecystitis
2.  Echogenic liver.
Findings most commonly seen in hepatic steatosis, though may also
represent hepatitis and/or fibrosis.

## 2021-06-12 IMAGING — CT CT ABD-PELV W/ CM
2 of 5 series · 16 of 46 positions shown, 18 images · IV contrast (Omnipaque)
Comparison: No priors.

CLINICAL DATA: 40-year-old female with history of nausea and
vomiting. Acute onset of upper abdominal pain.

EXAM:
CT ABDOMEN AND PELVIS WITH CONTRAST
TECHNIQUE: Multidetector CT imaging of the abdomen and pelvis was performed
using the standard protocol following bolus administration of
intravenous contrast.

[Series 3: axial st · axial · 0.98mm/px · z∈[-394,+6]mm · 13 of 92 slices shown, 15 images]
[im 6/92  soft-tissue]
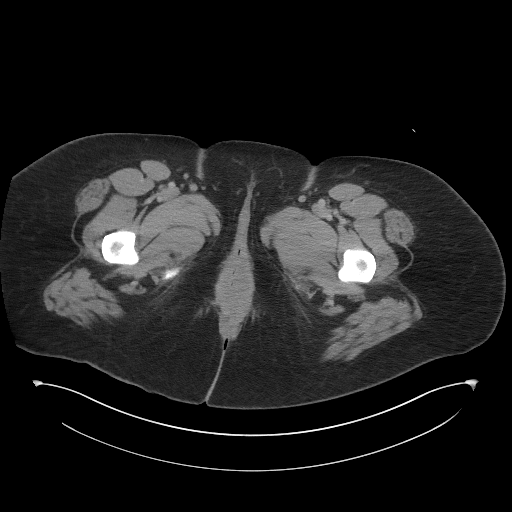
[im 6/92  bone]
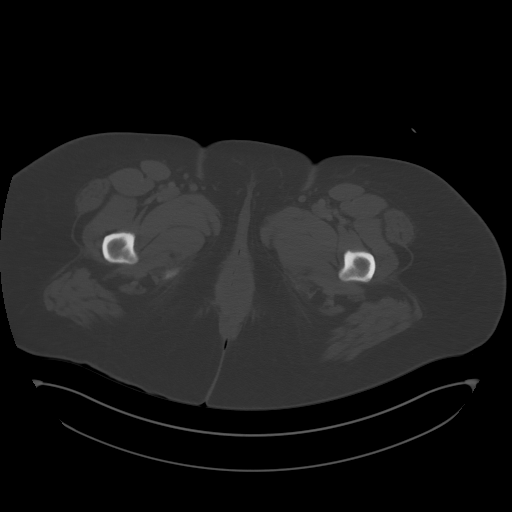
[im 12/92  soft-tissue]
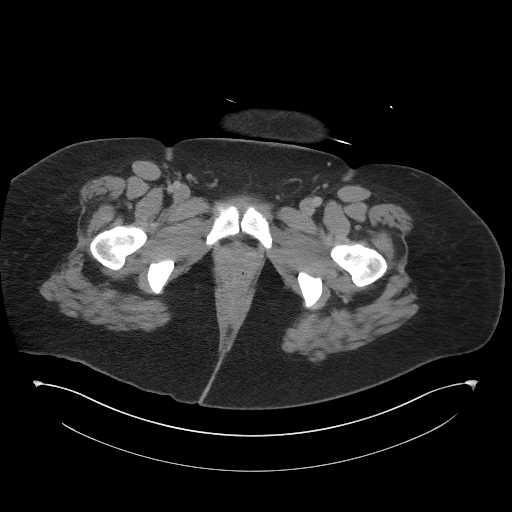
[im 18/92  soft-tissue]
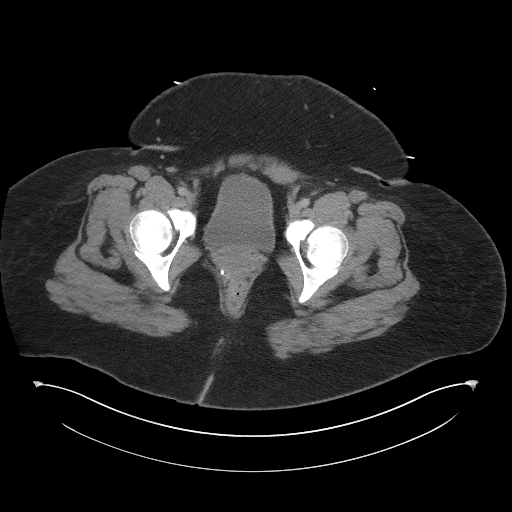
[im 29/92  soft-tissue]
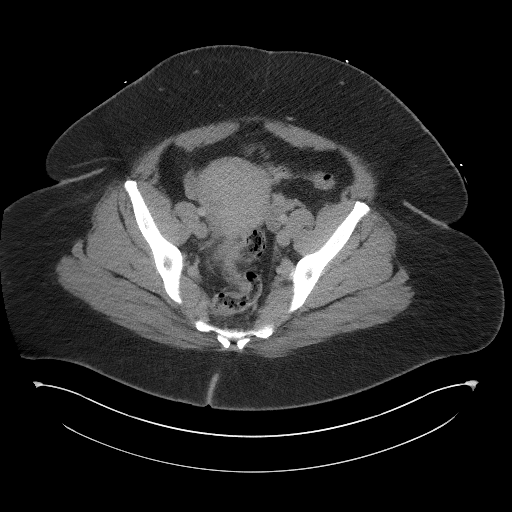
[im 35/92  soft-tissue]
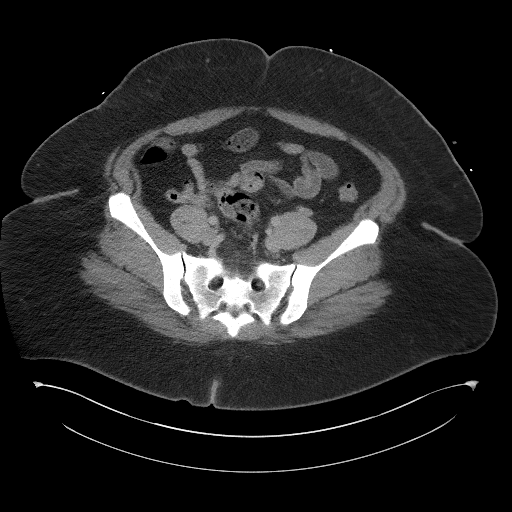
[im 40/92  soft-tissue]
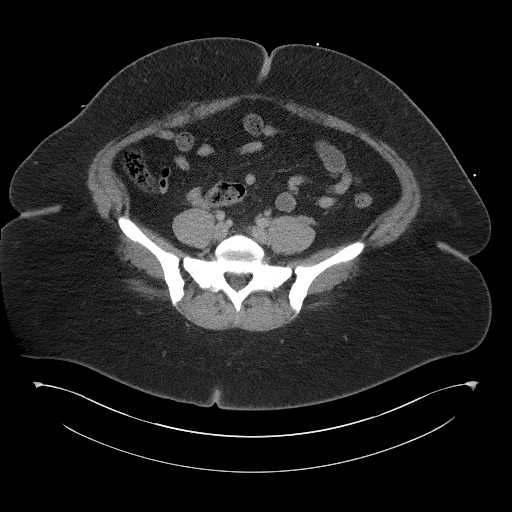
[im 46/92  soft-tissue]
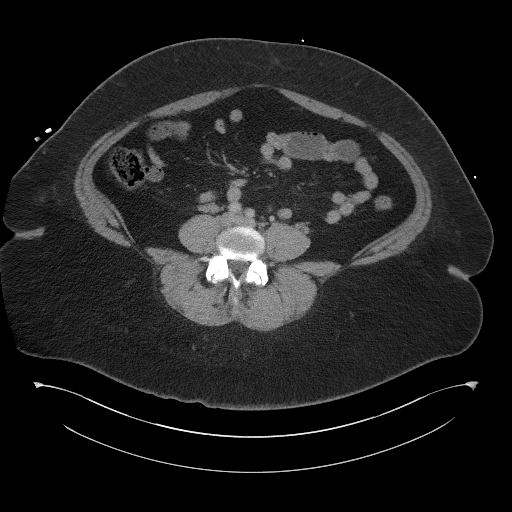
[im 52/92  soft-tissue]
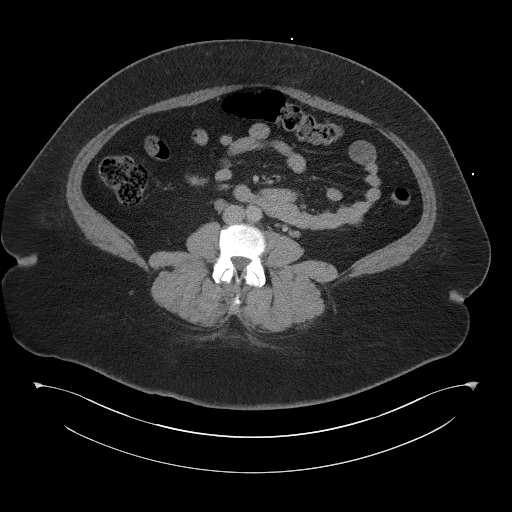
[im 57/92  soft-tissue]
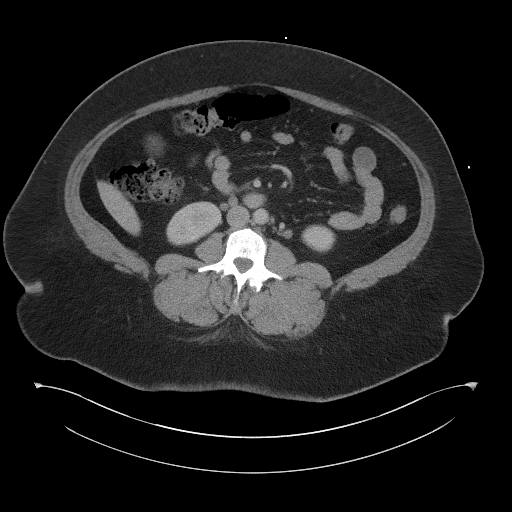
[im 57/92  bone]
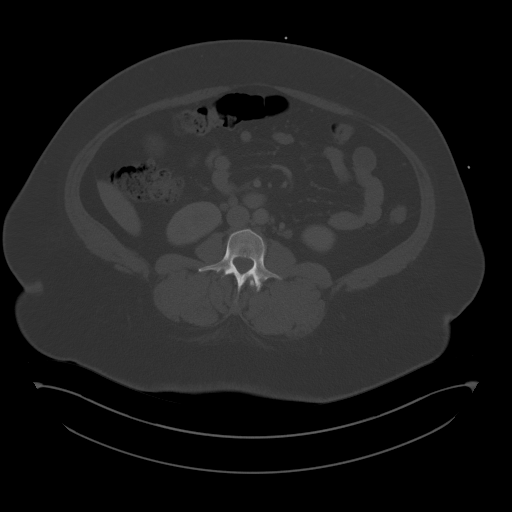
[im 63/92  soft-tissue]
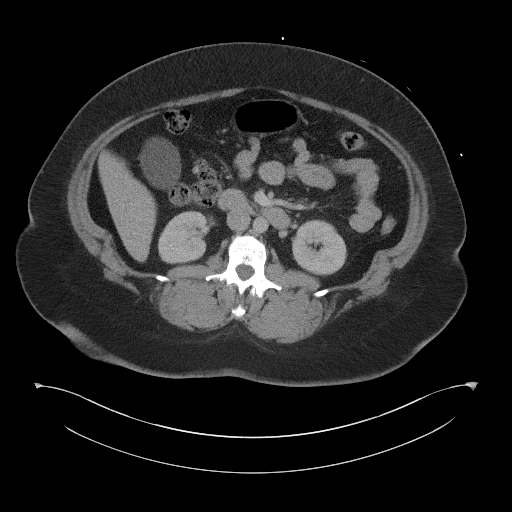
[im 74/92  soft-tissue]
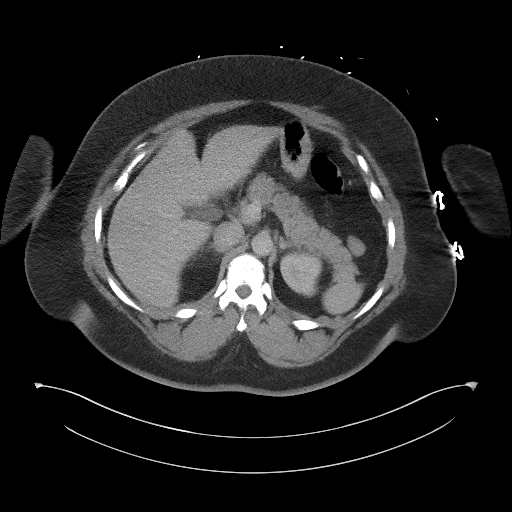
[im 80/92  soft-tissue]
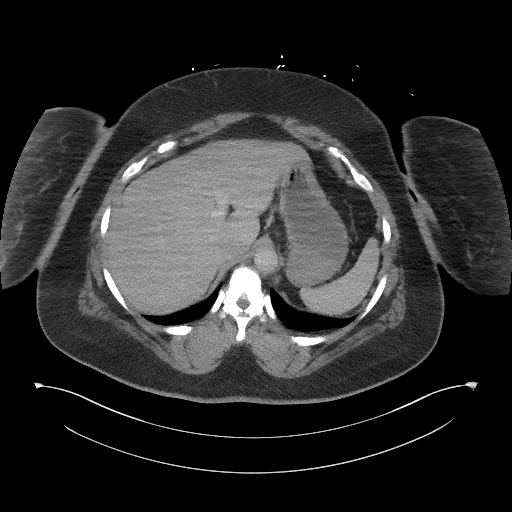
[im 86/92  soft-tissue]
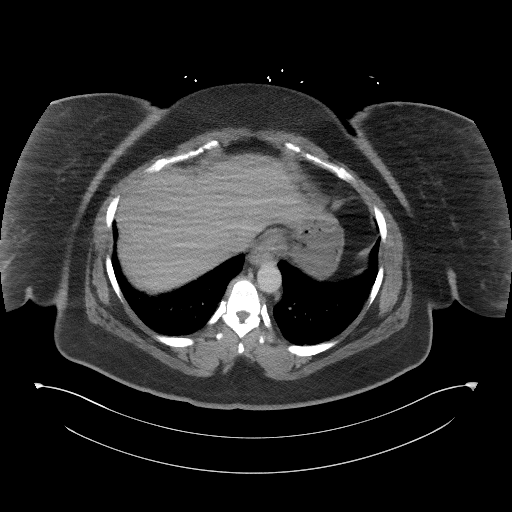

[Series 6: coronal st · coronal · 0.93mm/px · 3 of 123 slices shown]
[im 41/123  soft-tissue]
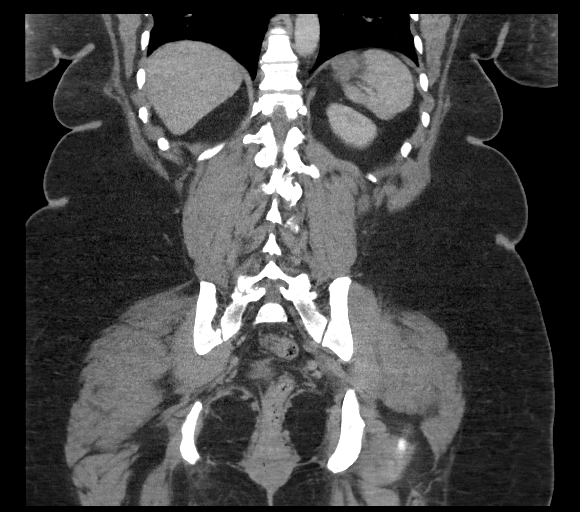
[im 55/123  soft-tissue]
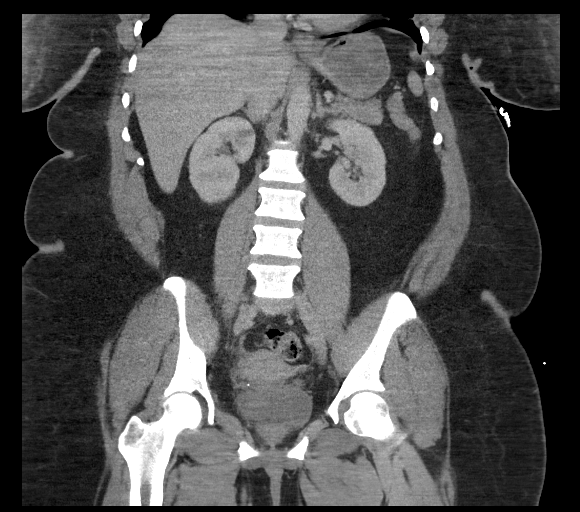
[im 68/123  soft-tissue]
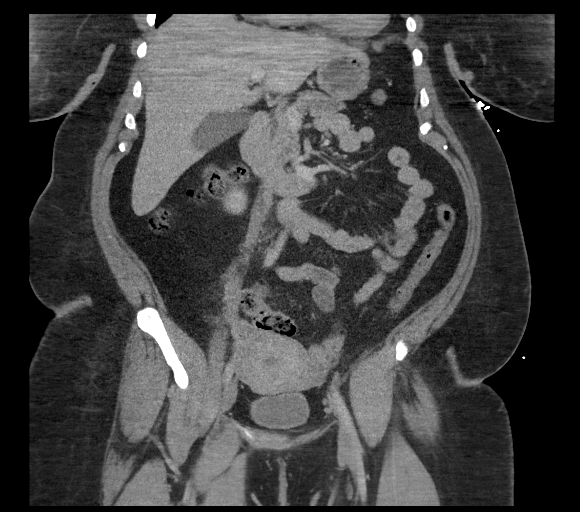

[16 of 46 positions shown; findings below may reference images not displayed]

RADIATION DOSE REDUCTION: This exam was performed according to the
departmental dose-optimization program which includes automated
exposure control, adjustment of the mA and/or kV according to
patient size and/or use of iterative reconstruction technique.

CONTRAST:  100mL OMNIPAQUE IOHEXOL 300 MG/ML  SOLN
FINDINGS: Lower chest: Small hiatal hernia.

Hepatobiliary: No suspicious cystic or solid hepatic lesions. No
intra or extrahepatic biliary ductal dilatation. Gallbladder is
normal in appearance.

Pancreas: No pancreatic mass. No pancreatic ductal dilatation. No
pancreatic or peripancreatic fluid collections or inflammatory
changes.

Spleen: Unremarkable.

Adrenals/Urinary Tract: Bilateral kidneys and adrenal glands are
normal in appearance. No hydroureteronephrosis. Urinary bladder is
normal in appearance.

Stomach/Bowel: Normal appearance of the stomach. No pathologic
dilatation of small bowel or colon. A few scattered colonic
diverticulae are noted, without surrounding inflammatory changes to
suggest an acute diverticulitis at this time. Status post
appendectomy.

Vascular/Lymphatic: No significant atherosclerotic disease, aneurysm
or dissection noted in the abdominal or pelvic vasculature. No
lymphadenopathy noted in the abdomen or pelvis.

Reproductive: Uterus and ovaries are unremarkable in appearance.

Other: No significant volume of ascites.  No pneumoperitoneum.

Musculoskeletal: There are no aggressive appearing lytic or blastic
lesions noted in the visualized portions of the skeleton.
IMPRESSION: 1. No acute findings are noted in the abdomen or pelvis to account
for the patient's symptoms.
2. Small hiatal hernia.
3. Mild colonic diverticulosis without evidence of acute
diverticulitis at this time.

## 2021-06-12 SURGERY — LAPAROSCOPIC CHOLECYSTECTOMY WITH INTRAOPERATIVE CHOLANGIOGRAM
Anesthesia: General

## 2021-06-12 MED ORDER — SODIUM CHLORIDE 0.9 % IV BOLUS
1000.0000 mL | Freq: Once | INTRAVENOUS | Status: AC
  Administered 2021-06-12: 1000 mL via INTRAVENOUS

## 2021-06-12 MED ORDER — SIMETHICONE 80 MG PO CHEW: 40.0000 mg | CHEWABLE_TABLET | Freq: Four times a day (QID) | ORAL | Status: DC | PRN

## 2021-06-12 MED ORDER — HYDRALAZINE HCL 20 MG/ML IJ SOLN: 10.0000 mg | Freq: Three times a day (TID) | INTRAMUSCULAR | Status: DC | PRN

## 2021-06-12 MED ORDER — SUCCINYLCHOLINE CHLORIDE 200 MG/10ML IV SOSY
PREFILLED_SYRINGE | INTRAVENOUS | Status: DC | PRN
  Administered 2021-06-12: 120 mg via INTRAVENOUS

## 2021-06-12 MED ORDER — PROPOFOL 10 MG/ML IV BOLUS
INTRAVENOUS | Status: DC | PRN
  Administered 2021-06-12: 200 mg via INTRAVENOUS

## 2021-06-12 MED ORDER — ONDANSETRON HCL 4 MG/2ML IJ SOLN: 4.0000 mg | Freq: Four times a day (QID) | INTRAMUSCULAR | Status: DC | PRN

## 2021-06-12 MED ORDER — DOCUSATE SODIUM 100 MG PO CAPS
100.0000 mg | ORAL_CAPSULE | Freq: Two times a day (BID) | ORAL | Status: DC
  Administered 2021-06-12 – 2021-06-13 (×2): 100 mg via ORAL
  Filled 2021-06-12 (×2): qty 1

## 2021-06-12 MED ORDER — ENOXAPARIN SODIUM 40 MG/0.4ML IJ SOSY
40.0000 mg | PREFILLED_SYRINGE | INTRAMUSCULAR | Status: DC
  Administered 2021-06-13: 40 mg via SUBCUTANEOUS
  Filled 2021-06-12: qty 0.4

## 2021-06-12 MED ORDER — ONDANSETRON 4 MG PO TBDP: 4.0000 mg | ORAL_TABLET | Freq: Four times a day (QID) | ORAL | Status: DC | PRN

## 2021-06-12 MED ORDER — MORPHINE SULFATE (PF) 2 MG/ML IV SOLN
2.0000 mg | INTRAVENOUS | Status: DC | PRN
  Filled 2021-06-12: qty 1

## 2021-06-12 MED ORDER — BUPIVACAINE-EPINEPHRINE 0.25% -1:200000 IJ SOLN
INTRAMUSCULAR | Status: DC | PRN
Start: 2021-06-12 — End: 2021-06-12
  Administered 2021-06-12: 26 mL

## 2021-06-12 MED ORDER — 0.9 % SODIUM CHLORIDE (POUR BTL) OPTIME
TOPICAL | Status: DC | PRN
Start: 2021-06-12 — End: 2021-06-12
  Administered 2021-06-12: 1000 mL

## 2021-06-12 MED ORDER — FENTANYL CITRATE (PF) 100 MCG/2ML IJ SOLN
INTRAMUSCULAR | Status: DC | PRN
  Administered 2021-06-12: 50 ug via INTRAVENOUS

## 2021-06-12 MED ORDER — ACETAMINOPHEN 500 MG PO TABS
1000.0000 mg | ORAL_TABLET | Freq: Four times a day (QID) | ORAL | Status: DC
  Administered 2021-06-12 – 2021-06-13 (×2): 1000 mg via ORAL
  Filled 2021-06-12 (×4): qty 2

## 2021-06-12 MED ORDER — SODIUM CHLORIDE 0.9 % IV SOLN: INTRAVENOUS | Status: DC

## 2021-06-12 MED ORDER — PHENOL 1.4 % MT LIQD
1.0000 | OROMUCOSAL | Status: DC | PRN
  Filled 2021-06-12: qty 177

## 2021-06-12 MED ORDER — FENTANYL CITRATE PF 50 MCG/ML IJ SOSY
50.0000 ug | PREFILLED_SYRINGE | Freq: Once | INTRAMUSCULAR | Status: AC
  Administered 2021-06-12: 50 ug via INTRAVENOUS
  Filled 2021-06-12: qty 1

## 2021-06-12 MED ORDER — PANTOPRAZOLE SODIUM 40 MG IV SOLR
40.0000 mg | Freq: Once | INTRAVENOUS | Status: AC
  Administered 2021-06-12: 40 mg via INTRAVENOUS
  Filled 2021-06-12: qty 10

## 2021-06-12 MED ORDER — SODIUM CHLORIDE 0.9 % IV SOLN
2.0000 g | Freq: Once | INTRAVENOUS | Status: AC
  Administered 2021-06-12: 2 g via INTRAVENOUS
  Filled 2021-06-12: qty 20

## 2021-06-12 MED ORDER — IBUPROFEN 400 MG PO TABS: 600.0000 mg | ORAL_TABLET | Freq: Four times a day (QID) | ORAL | Status: DC | PRN

## 2021-06-12 MED ORDER — ONDANSETRON HCL 4 MG/2ML IJ SOLN
INTRAMUSCULAR | Status: DC | PRN
  Administered 2021-06-12: 4 mg via INTRAVENOUS

## 2021-06-12 MED ORDER — SUGAMMADEX SODIUM 200 MG/2ML IV SOLN
INTRAVENOUS | Status: DC | PRN
  Administered 2021-06-12: 200 mg via INTRAVENOUS

## 2021-06-12 MED ORDER — HYDROMORPHONE HCL 1 MG/ML IJ SOLN: 0.2500 mg | INTRAMUSCULAR | Status: DC | PRN

## 2021-06-12 MED ORDER — PHENYLEPHRINE 40 MCG/ML (10ML) SYRINGE FOR IV PUSH (FOR BLOOD PRESSURE SUPPORT)
PREFILLED_SYRINGE | INTRAVENOUS | Status: AC
  Filled 2021-06-12: qty 10

## 2021-06-12 MED ORDER — IOHEXOL 300 MG/ML  SOLN
100.0000 mL | Freq: Once | INTRAMUSCULAR | Status: AC | PRN
  Administered 2021-06-12: 100 mL via INTRAVENOUS

## 2021-06-12 MED ORDER — PHENYLEPHRINE 40 MCG/ML (10ML) SYRINGE FOR IV PUSH (FOR BLOOD PRESSURE SUPPORT)
PREFILLED_SYRINGE | INTRAVENOUS | Status: DC | PRN
  Administered 2021-06-12: 80 ug via INTRAVENOUS
  Administered 2021-06-12: 120 ug via INTRAVENOUS
  Administered 2021-06-12: 80 ug via INTRAVENOUS
  Administered 2021-06-12: 120 ug via INTRAVENOUS

## 2021-06-12 MED ORDER — HYDROMORPHONE HCL 1 MG/ML IJ SOLN
1.0000 mg | Freq: Once | INTRAMUSCULAR | Status: AC
  Administered 2021-06-12: 1 mg via INTRAVENOUS
  Filled 2021-06-12: qty 1

## 2021-06-12 MED ORDER — DEXAMETHASONE SODIUM PHOSPHATE 10 MG/ML IJ SOLN
INTRAMUSCULAR | Status: DC | PRN
  Administered 2021-06-12: 10 mg via INTRAVENOUS

## 2021-06-12 MED ORDER — NALOXONE HCL 0.4 MG/ML IJ SOLN
INTRAMUSCULAR | Status: AC
  Filled 2021-06-12: qty 1

## 2021-06-12 MED ORDER — DIPHENHYDRAMINE HCL 25 MG PO CAPS: 25.0000 mg | ORAL_CAPSULE | Freq: Four times a day (QID) | ORAL | Status: DC | PRN

## 2021-06-12 MED ORDER — BUPIVACAINE-EPINEPHRINE (PF) 0.25% -1:200000 IJ SOLN
INTRAMUSCULAR | Status: AC
  Filled 2021-06-12: qty 30

## 2021-06-12 MED ORDER — ACETAMINOPHEN 10 MG/ML IV SOLN
INTRAVENOUS | Status: AC
Start: 2021-06-12 — End: 2021-06-12
  Filled 2021-06-12: qty 100

## 2021-06-12 MED ORDER — MIDAZOLAM HCL 5 MG/5ML IJ SOLN
INTRAMUSCULAR | Status: DC | PRN
  Administered 2021-06-12: 2 mg via INTRAVENOUS

## 2021-06-12 MED ORDER — FENTANYL CITRATE (PF) 100 MCG/2ML IJ SOLN
INTRAMUSCULAR | Status: AC
  Filled 2021-06-12: qty 2

## 2021-06-12 MED ORDER — LACTATED RINGERS IV SOLN: INTRAVENOUS | Status: DC

## 2021-06-12 MED ORDER — ACETAMINOPHEN 10 MG/ML IV SOLN
INTRAVENOUS | Status: DC | PRN
  Administered 2021-06-12: 1000 mg via INTRAVENOUS

## 2021-06-12 MED ORDER — DEXAMETHASONE SODIUM PHOSPHATE 10 MG/ML IJ SOLN
INTRAMUSCULAR | Status: AC
  Filled 2021-06-12: qty 1

## 2021-06-12 MED ORDER — PROPOFOL 10 MG/ML IV BOLUS
INTRAVENOUS | Status: AC
  Filled 2021-06-12: qty 20

## 2021-06-12 MED ORDER — OXYCODONE HCL 5 MG PO TABS
5.0000 mg | ORAL_TABLET | ORAL | Status: DC | PRN
  Administered 2021-06-13 (×2): 10 mg via ORAL
  Filled 2021-06-12 (×2): qty 2

## 2021-06-12 MED ORDER — BISACODYL 10 MG RE SUPP: 10.0000 mg | Freq: Every day | RECTAL | Status: DC | PRN

## 2021-06-12 MED ORDER — POLYETHYLENE GLYCOL 3350 17 G PO PACK: 17.0000 g | PACK | Freq: Every day | ORAL | Status: DC | PRN

## 2021-06-12 MED ORDER — MELATONIN 3 MG PO TABS: 3.0000 mg | ORAL_TABLET | Freq: Every evening | ORAL | Status: DC | PRN

## 2021-06-12 MED ORDER — ROCURONIUM BROMIDE 10 MG/ML (PF) SYRINGE
PREFILLED_SYRINGE | INTRAVENOUS | Status: DC | PRN
  Administered 2021-06-12: 50 mg via INTRAVENOUS

## 2021-06-12 MED ORDER — LIDOCAINE 2% (20 MG/ML) 5 ML SYRINGE
INTRAMUSCULAR | Status: DC | PRN
  Administered 2021-06-12: 50 mg via INTRAVENOUS

## 2021-06-12 MED ORDER — METHOCARBAMOL 500 MG PO TABS: 500.0000 mg | ORAL_TABLET | Freq: Four times a day (QID) | ORAL | Status: DC | PRN

## 2021-06-12 MED ORDER — ONDANSETRON HCL 4 MG/2ML IJ SOLN
INTRAMUSCULAR | Status: AC
  Filled 2021-06-12: qty 2

## 2021-06-12 MED ORDER — CHLORHEXIDINE GLUCONATE 0.12 % MT SOLN
15.0000 mL | OROMUCOSAL | Status: AC
  Administered 2021-06-12: 15 mL via OROMUCOSAL

## 2021-06-12 MED ORDER — LACTATED RINGERS IR SOLN
Status: DC | PRN
Start: 2021-06-12 — End: 2021-06-12
  Administered 2021-06-12: 1000 mL

## 2021-06-12 MED ORDER — MIDAZOLAM HCL 2 MG/2ML IJ SOLN
INTRAMUSCULAR | Status: AC
  Filled 2021-06-12: qty 2

## 2021-06-12 MED ORDER — DIPHENHYDRAMINE HCL 50 MG/ML IJ SOLN: 25.0000 mg | Freq: Four times a day (QID) | INTRAMUSCULAR | Status: DC | PRN

## 2021-06-12 MED ORDER — ONDANSETRON HCL 4 MG/2ML IJ SOLN
4.0000 mg | Freq: Once | INTRAMUSCULAR | Status: AC
  Administered 2021-06-12: 4 mg via INTRAVENOUS
  Filled 2021-06-12: qty 2

## 2021-06-12 SURGICAL SUPPLY — 46 items
APPLIER CLIP 5 13 M/L LIGAMAX5 (MISCELLANEOUS) ×2
BAG COUNTER SPONGE SURGICOUNT (BAG) IMPLANT
CABLE HIGH FREQUENCY MONO STRZ (ELECTRODE) ×1 IMPLANT
CHLORAPREP W/TINT 26 (MISCELLANEOUS) ×2 IMPLANT
CLIP APPLIE 5 13 M/L LIGAMAX5 (MISCELLANEOUS) ×1 IMPLANT
COVER MAYO STAND XLG (MISCELLANEOUS) ×2 IMPLANT
COVER SURGICAL LIGHT HANDLE (MISCELLANEOUS) ×4 IMPLANT
DERMABOND ADVANCED (GAUZE/BANDAGES/DRESSINGS) ×1
DERMABOND ADVANCED .7 DNX12 (GAUZE/BANDAGES/DRESSINGS) ×1 IMPLANT
DEVICE TROCAR PUNCTURE CLOSURE (ENDOMECHANICALS) IMPLANT
DRAPE C-ARM 42X120 X-RAY (DRAPES) IMPLANT
DRAPE LAPAROSCOPIC ABDOMINAL (DRAPES) ×2 IMPLANT
ELECT L-HOOK LAP 45CM DISP (ELECTROSURGICAL) ×2
ELECT REM PT RETURN 15FT ADLT (MISCELLANEOUS) ×2 IMPLANT
ELECTRODE L-HOOK LAP 45CM DISP (ELECTROSURGICAL) IMPLANT
GLOVE SURG ENC MOIS LTX SZ6.5 (GLOVE) ×2 IMPLANT
GLOVE SURG UNDER LTX SZ6.5 (GLOVE) ×2 IMPLANT
GLOVE SURG UNDER POLY LF SZ7 (GLOVE) ×2 IMPLANT
GOWN STRL REUS W/ TWL XL LVL3 (GOWN DISPOSABLE) ×3 IMPLANT
GOWN STRL REUS W/TWL XL LVL3 (GOWN DISPOSABLE) ×3
GRASPER SUT TROCAR 14GX15 (MISCELLANEOUS) ×1 IMPLANT
IRRIG SUCT STRYKERFLOW 2 WTIP (MISCELLANEOUS) ×2
IRRIGATION SUCT STRKRFLW 2 WTP (MISCELLANEOUS) ×1 IMPLANT
IV CATH 14GX2 1/4 (CATHETERS) IMPLANT
KIT BASIN OR (CUSTOM PROCEDURE TRAY) ×2 IMPLANT
KIT TURNOVER KIT A (KITS) IMPLANT
NDL INSUFFLATION 14GA 120MM (NEEDLE) IMPLANT
NEEDLE INSUFFLATION 14GA 120MM (NEEDLE) ×2 IMPLANT
PENCIL SMOKE EVACUATOR (MISCELLANEOUS) IMPLANT
POUCH SPECIMEN RETRIEVAL 10MM (ENDOMECHANICALS) ×2 IMPLANT
SCISSORS LAP 5X35 DISP (ENDOMECHANICALS) ×2 IMPLANT
SET CHOLANGIOGRAPH MIX (MISCELLANEOUS) IMPLANT
SET TUBE SMOKE EVAC HIGH FLOW (TUBING) ×2 IMPLANT
SLEEVE XCEL OPT CAN 5 100 (ENDOMECHANICALS) ×4 IMPLANT
SUT VIC AB 2-0 SH 27 (SUTURE) ×1
SUT VIC AB 2-0 SH 27X BRD (SUTURE) IMPLANT
SUT VIC AB 4-0 PS2 18 (SUTURE) ×2 IMPLANT
SUT VIC AB 4-0 PS2 27 (SUTURE) ×1 IMPLANT
SUT VICRYL 0 UR6 27IN ABS (SUTURE) IMPLANT
SUT VICRYL 4-0 PS2 18IN ABS (SUTURE) ×1 IMPLANT
TOWEL OR 17X26 10 PK STRL BLUE (TOWEL DISPOSABLE) ×2 IMPLANT
TOWEL OR NON WOVEN STRL DISP B (DISPOSABLE) ×2 IMPLANT
TRAY LAPAROSCOPIC (CUSTOM PROCEDURE TRAY) ×2 IMPLANT
TROCAR BLADELESS OPT 5 100 (ENDOMECHANICALS) ×2 IMPLANT
TROCAR XCEL BLUNT TIP 100MML (ENDOMECHANICALS) IMPLANT
TROCAR XCEL NON-BLD 11X100MML (ENDOMECHANICALS) IMPLANT

## 2021-06-12 NOTE — ED Notes (Signed)
In Ct  

## 2021-06-12 NOTE — Anesthesia Postprocedure Evaluation (Signed)
Anesthesia Post Note ? ?Patient: Angela Ayers ? ?Procedure(s) Performed: LAPAROSCOPIC CHOLECYSTECTOMY ? ?  ? ?Patient location during evaluation: PACU ?Anesthesia Type: General ?Level of consciousness: awake and alert ?Pain management: pain level controlled ?Vital Signs Assessment: post-procedure vital signs reviewed and stable ?Respiratory status: spontaneous breathing, nonlabored ventilation and respiratory function stable ?Cardiovascular status: blood pressure returned to baseline and stable ?Postop Assessment: no apparent nausea or vomiting ?Anesthetic complications: no ? ? ?No notable events documented. ? ?Last Vitals:  ?Vitals:  ? 06/12/21 1520 06/12/21 1530  ?BP:  (!) 159/93  ?Pulse: 75 73  ?Resp: (!) 22 20  ?Temp:  (!) 36.2 ?C  ?SpO2: 94% 95%  ?  ?Last Pain:  ?Vitals:  ? 06/12/21 1530  ?TempSrc:   ?PainSc: 0-No pain  ? ? ?  ?  ?  ?  ?  ?  ? ?Ambra Haverstick,W. EDMOND ? ? ? ? ?

## 2021-06-12 NOTE — Anesthesia Procedure Notes (Signed)
Procedure Name: Intubation ?Date/Time: 06/12/2021 12:57 PM ?Performed by: Gerald Leitz, CRNA ?Pre-anesthesia Checklist: Patient identified, Patient being monitored, Timeout performed, Emergency Drugs available and Suction available ?Patient Re-evaluated:Patient Re-evaluated prior to induction ?Oxygen Delivery Method: Circle system utilized ?Preoxygenation: Pre-oxygenation with 100% oxygen ?Induction Type: IV induction and Rapid sequence ?Ventilation: Mask ventilation without difficulty ?Laryngoscope Size: Mac and 3 ?Grade View: Grade I ?Tube type: Oral ?Tube size: 7.0 mm ?Number of attempts: 1 ?Airway Equipment and Method: Stylet ?Placement Confirmation: ETT inserted through vocal cords under direct vision, positive ETCO2 and breath sounds checked- equal and bilateral ?Secured at: 22 cm ?Tube secured with: Tape ?Dental Injury: Teeth and Oropharynx as per pre-operative assessment  ?Difficulty Due To: Difficult Airway- due to anterior larynx ? ? ? ? ?

## 2021-06-12 NOTE — Op Note (Signed)
06/12/2021 ? ?1:57 PM ? ?PATIENT:  Angela Ayers  41 y.o. female ? ?Patient Care Team: ?[facility], [facility] as PCP - General (Pediatrics) ? ?PRE-OPERATIVE DIAGNOSIS:  CHOLCYSTITIS ? ?POST-OPERATIVE DIAGNOSIS:  CHOLCYSTITIS ? ?PROCEDURE:  LAPAROSCOPIC CHOLECYSTECTOMY ? ?  Surgeon(s): ?Leighton Ruff, MD ? ?ASSISTANT: Alferd Apa, PA  ? ?ANESTHESIA:   local and general ? ?EBL: 20 ml Total I/O ?In: 1200 [I.V.:1000; IV Piggyback:200] ?Out: -  ? ?DRAINS: none  ? ?SPECIMEN:  Source of Specimen:  gallbladder ? ?DISPOSITION OF SPECIMEN:  PATHOLOGY ? ?COUNTS:  YES ? ?PLAN OF CARE: Admit for overnight observation ? ?PATIENT DISPOSITION:  PACU - hemodynamically stable. ? ?INDICATION: 41 y.o. F with RUQ pain and signs consistent with cholecystitis.  I recommended laparoscopic cholecystectomy ? ?The anatomy & physiology of hepatobiliary & pancreatic function was discussed.  The pathophysiology of gallbladder dysfunction was discussed.  Natural history risks without surgery was discussed.   I feel the risks of no intervention will lead to serious problems that outweigh the operative risks; therefore, I recommended cholecystectomy to remove the pathology.  I explained laparoscopic techniques with possible need for an open approach.  Probable cholangiogram to evaluate the bilary tract was explained as well.   ? ?Risks such as bleeding, infection, abscess, leak, injury to other organs, need for further treatment, heart attack, death, and other risks were discussed.  I noted a good likelihood this will help address the problem.  Possibility that this will not correct all abdominal symptoms was explained.  Goals of post-operative recovery were discussed as well.   ? ?OR FINDINGS: early acute cholecystitis  ? ?DESCRIPTION:  ? ?The patient was identified & brought into the operating room. The patient was positioned supine with arms tucked. SCDs were active during the entire case. The patient underwent general  anesthesia without any difficulty.  The abdomen was prepped and draped in a sterile fashion. A Surgical Timeout was performed and confirmed our plan. ? ?We positioned the patient in reverse [place] & left side up side up.  I used a Varies needle in the LUQ to insufflate the abdomen.  A 5 mm port was placed at midline.  Entry was clean.  Camera inspection revealed no injury at either site.  There were no adhesions to the anterior abdominal wall supraumbilically.  We induced carbon dioxide insufflation.  I proceeded to continue with laparoscopic technique. I placed an 11 mm port in mid subcostal region, another 63m port in the right flank near the anterior axillary line, and a 565mport in the left subxiphoid region obliquely within the falciform ligament. ? ?I turned attention to the right upper quadrant.  The gallbladder fundus was tense.  This was suctioned with a puncture aspirator device.  The gallbladder was then elevated cephalad. I used cautery and blunt dissection to free the peritoneal coverings between the gallbladder and the liver on the posteriolateral and anteriomedial walls.   I used careful blunt and cautery dissection to help get a good critical view of the cystic artery and cystic duct. I did further dissection to free a few centimeters of the  gallbladder off the liver bed to get a good critical view of the infundibulum and cystic duct. I mobilized the cystic artery, which was posterior to the gallbladder.  I skeletonized the cystic duct.  After getting a good 360? view, I decided not to perform a cholangiogram.  I placed a clip on the infundibulum. ? ?I placed clips on the cystic  duct x3.  I completed cystic duct transection.   I placed clips on the cystic artery x3 with 2 proximally.  I ligated the cystic artery using cautery well above the clips. I freed the gallbladder from its remaining attachments to the liver. I ensured hemostasis on the gallbladder fossa of the liver and elsewhere. I  inspected the rest of the abdomen & detected no injury nor bleeding elsewhere.  I irrigated the RUQ with normal saline. ? ?I removed the gallbladder through the subxiphoid port site.  I closed the fascia at this site using 0 Vicryl stitches and the PMI device under lap visualization.   We closed the skin using 4-0 vicryl stitch.  Sterile dressings were applied. The patient was extubated & arrived in the PACU in stable condition. ? ?I had discussed postoperative care with the patient in the holding area.  I will discuss  operative findings and postoperative goals / instructions with the patient's family.  Instructions are written in the chart as well. ? ?Rosario Adie, MD ? ?Colorectal and General Surgery ?[facility]  ? ?

## 2021-06-12 NOTE — Transfer of Care (Signed)
Immediate Anesthesia Transfer of Care Note ? ?Patient: Angela Ayers ? ?Procedure(s) Performed: Procedure(s): ?LAPAROSCOPIC CHOLECYSTECTOMY (N/A) ? ?Patient Location: PACU ? ?Anesthesia Type:General ? ?Level of Consciousness: Alert, Awake, Oriented ? ?Airway & Oxygen Therapy: Patient Spontanous Breathing ? ?Post-op Assessment: Report given to RN ? ?Post vital signs: Reviewed and stable ? ?Last Vitals:  ?Vitals:  ? 06/12/21 1136 06/12/21 1430  ?BP: (!) 179/94 136/74  ?Pulse: 71 93  ?Resp: 15 (!) 22  ?Temp: 36.6 ?C (!) 36.1 ?C  ?SpO2: 92% 93%  ? ? ?Complications: No apparent anesthesia complications ? ?

## 2021-06-12 NOTE — H&P (Signed)
? ? ? ? ?H&P Note ? ?Angela Ayers ?23-Dec-1980  ?[id number].   ? ?Requesting MD: Quintella Reichert, MD ?Chief Complaint/Reason for Consult: abdominal pain  ?HPI:  ?Patient is a 41 year old female who presented to the ED with abdominal pain starting around 10 PM last night. She had spaghetti for dinner. Pain is epigastric and burning. Associated nausea and vomiting. Emesis was just what she had eaten for dinner. Denies fever, chills, chest pain, SOB, diarrhea, urinary symptoms. No previous episodes of similar pain. PMH significant for Hx of asthma, not currently on any inhalers. Prior abdominal surgery includes appendectomy. Allergy to latex. Denies alcohol or illicit drug use. Quit smoking cigarettes in January.  ? ?ROS: ?Review of Systems  ?Constitutional:  Negative for chills and fever.  ?Respiratory:  Negative for shortness of breath and wheezing.   ?Cardiovascular:  Negative for chest pain and palpitations.  ?Gastrointestinal:  Positive for abdominal pain, nausea and vomiting. Negative for diarrhea.  ?Genitourinary:  Negative for dysuria and urgency.  ? ?History reviewed. No pertinent family history. ? ?Past Medical History:  ?Diagnosis Date  ? Asthma   ? ? ?Past Surgical History:  ?Procedure Laterality Date  ? APPENDECTOMY    ? CESAREAN SECTION    ? ? ?Social History:  reports that she has been smoking cigarettes. She has been smoking an average of .5 packs per day. She has never used smokeless tobacco. She reports that she does not drink alcohol and does not use drugs. ? ?Allergies:  ?Allergies  ?Allergen Reactions  ? Latex   ?  hives  ? ? ?(Not in a hospital admission) ? ? ?Blood pressure (!) 187/105, pulse 61, temperature 97.6 ?F (36.4 ?C), resp. rate 18, height '5\' 5"'$  (1.651 m), weight 104.3 kg, last menstrual period 05/28/2021, SpO2 100 %. ?Physical Exam:  ?General: pleasant, WD, morbidly obese female who is laying in bed in NAD ?HEENT: head is normocephalic, atraumatic.  Sclera are anicteric.  Ears and nose  without any masses or lesions.  Mouth is pink and moist ?Heart: regular, rate, and rhythm.  Normal s1,s2. No obvious murmurs, gallops, or rubs noted.  Palpable radial and pedal pulses bilaterally ?Lungs: CTAB, no wheezes, rhonchi, or rales noted.  Respiratory effort nonlabored ?Abd: soft, ttp in epigastrium and RUQ, ND, +BS, no masses, hernias, or organomegaly ?MS: all 4 extremities are symmetrical with no cyanosis, clubbing, or edema. ?Skin: warm and dry with no masses, lesions, or rashes ?Neuro: Cranial nerves 2-12 grossly intact, sensation is normal throughout ?Psych: A&Ox3 with an appropriate affect. ? ? ?Results for orders placed or performed during the hospital encounter of 06/12/21 (from the past 48 hour(s))  ?Lipase, blood     Status: None  ? Collection Time: 06/12/21  5:56 AM  ?Result Value Ref Range  ? Lipase 27 11 - 51 U/L  ?  Comment: Performed at [facility], [address] [place], [place] [address]  ?Comprehensive metabolic panel     Status: Abnormal  ? Collection Time: 06/12/21  5:56 AM  ?Result Value Ref Range  ? Sodium 139 135 - 145 mmol/L  ? Potassium 4.0 3.5 - 5.1 mmol/L  ? Chloride 107 98 - 111 mmol/L  ? CO2 24 22 - 32 mmol/L  ? Glucose, Bld 129 (H) 70 - 99 mg/dL  ?  Comment: Glucose reference range applies only to samples taken after fasting for at least 8 hours.  ? BUN 15 6 - 20 mg/dL  ? Creatinine, Ser  0.82 0.44 - 1.00 mg/dL  ? Calcium 8.7 (L) 8.9 - 10.3 mg/dL  ? Total Protein 7.5 6.5 - 8.1 g/dL  ? Albumin 3.8 3.5 - 5.0 g/dL  ? AST 18 15 - 41 U/L  ? ALT 15 0 - 44 U/L  ? Alkaline Phosphatase 55 38 - 126 U/L  ? Total Bilirubin 0.4 0.3 - 1.2 mg/dL  ? GFR, Estimated >60 >60 mL/min  ?  Comment: (NOTE) ?Calculated using the CKD-EPI Creatinine Equation (2021) ?  ? Anion gap 8 5 - 15  ?  Comment: Performed at [facility], [address], [place], [place] [address]  ?CBC     Status: Abnormal  ? Collection Time: 06/12/21  5:56 AM  ?Result Value Ref Range  ? WBC 7.4 4.0 - 10.5  K/uL  ? RBC 4.24 3.87 - 5.11 MIL/uL  ? Hemoglobin 10.6 (L) 12.0 - 15.0 g/dL  ? HCT 32.9 (L) 36.0 - 46.0 %  ? MCV 77.6 (L) 80.0 - 100.0 fL  ? MCH 25.0 (L) 26.0 - 34.0 pg  ? MCHC 32.2 30.0 - 36.0 g/dL  ? RDW 16.8 (H) 11.5 - 15.5 %  ? Platelets 418 (H) 150 - 400 K/uL  ? nRBC 0.0 0.0 - 0.2 %  ?  Comment: Performed at [facility], [address] [place], [place] [address]  ?hCG, serum, qualitative     Status: None  ? Collection Time: 06/12/21  6:15 AM  ?Result Value Ref Range  ? Preg, Serum NEGATIVE NEGATIVE  ?  Comment:        ?THE SENSITIVITY OF THIS ?METHODOLOGY IS >10 mIU/mL. ?Performed at [facility], [address], [place], [place] [address] ?  ?Urinalysis, Routine w reflex microscopic     Status: Abnormal  ? Collection Time: 06/12/21  7:31 AM  ?Result Value Ref Range  ? Color, Urine STRAW (A) YELLOW  ? APPearance CLEAR CLEAR  ? Specific Gravity, Urine 1.015 1.005 - 1.030  ? pH 6.0 5.0 - 8.0  ? Glucose, UA NEGATIVE NEGATIVE mg/dL  ? Hgb urine dipstick TRACE (A) NEGATIVE  ? Bilirubin Urine NEGATIVE NEGATIVE  ? Ketones, ur NEGATIVE NEGATIVE mg/dL  ? Protein, ur NEGATIVE NEGATIVE mg/dL  ? Nitrite NEGATIVE NEGATIVE  ? Leukocytes,Ua NEGATIVE NEGATIVE  ?  Comment: Performed at [facility], [address] [place], [place] [address]  ?Urinalysis, Microscopic (reflex)     Status: Abnormal  ? Collection Time: 06/12/21  7:31 AM  ?Result Value Ref Range  ? RBC / HPF 0-5 0 - 5 RBC/hpf  ? WBC, UA 0-5 0 - 5 WBC/hpf  ? Bacteria, UA RARE (A) NONE SEEN  ? Squamous Epithelial / LPF 0-5 0 - 5  ? Trichomonas, UA PRESENT (A) NONE SEEN  ?  Comment: Performed at [facility], [address], [place], [place] [address]  ? ?CT Abdomen Pelvis W Contrast ? ?Result Date: 06/12/2021 ?CLINICAL DATA:  41 year old female with history of nausea and vomiting. Acute onset of upper abdominal pain. EXAM: CT ABDOMEN AND PELVIS WITH CONTRAST TECHNIQUE: Multidetector CT imaging of the abdomen and pelvis was  performed using the standard protocol following bolus administration of intravenous contrast. RADIATION DOSE REDUCTION: This exam was performed according to the departmental dose-optimization program which includes automated exposure control, adjustment of the mA and/or kV according to patient size and/or use of iterative reconstruction technique. CONTRAST:  163m OMNIPAQUE IOHEXOL 300 MG/ML  SOLN COMPARISON:  No priors. FINDINGS: Lower chest: Small hiatal hernia. Hepatobiliary: No suspicious cystic or solid hepatic lesions. No intra or extrahepatic biliary ductal dilatation. Gallbladder is normal in appearance. Pancreas: No pancreatic mass. No pancreatic ductal dilatation. No pancreatic or peripancreatic fluid collections or inflammatory changes. Spleen: Unremarkable. Adrenals/Urinary Tract: Bilateral kidneys and adrenal glands are normal in appearance. No hydroureteronephrosis. Urinary bladder is normal in appearance. Stomach/Bowel: Normal appearance of the stomach. No pathologic dilatation of small bowel or colon. A few scattered colonic diverticulae are noted, without surrounding inflammatory changes to suggest an acute diverticulitis at this time. Status post appendectomy. Vascular/Lymphatic: No significant atherosclerotic disease, aneurysm or dissection noted in the abdominal or pelvic vasculature. No lymphadenopathy noted in the abdomen or pelvis. Reproductive: Uterus and ovaries are unremarkable in appearance. Other: No significant volume of ascites.  No pneumoperitoneum. Musculoskeletal: There are no aggressive appearing lytic or blastic lesions noted in the visualized portions of the skeleton. IMPRESSION: 1. No acute findings are noted in the abdomen or pelvis to account for the patient's symptoms. 2. Small hiatal hernia. 3. Mild colonic diverticulosis without evidence of acute diverticulitis at this time. Electronically Signed   By: Vinnie Langton M.D.   On: 06/12/2021 07:09  ? ?US Abdomen Limited RUQ  (LIVER/GB) ? ?Result Date: 06/12/2021 ?CLINICAL DATA:  Abdominal pain.  Nausea vomiting. EXAM: ULTRASOUND ABDOMEN LIMITED RIGHT UPPER QUADRANT COMPARISON:  CT AP, 06/12/2021. FINDINGS: Gallbladder: Multip

## 2021-06-12 NOTE — ED Triage Notes (Signed)
Patient reports centralized abdominal pain with N/V that started at 2200 lastnight. LBM was lastnight. Patient LMP was 05/28/2021. Patient rates pain 7/10.  ?

## 2021-06-12 NOTE — ED Notes (Signed)
Reports given to [facility] and Bethena Roys  in short stay ?

## 2021-06-12 NOTE — Discharge Instructions (Addendum)
Please do not drive or operate heavy machinery while taking narcotic pain medication (oxycodone) ?Pease do not drink alcohol while taking flagyl - this can make you feel very sick ? ?[facility] ? ?Please arrive at least 30 min before your appointment to complete your check in paperwork.  If you are unable to arrive 30 min prior to your appointment time we may have to cancel or reschedule you. ?LAPAROSCOPIC SURGERY: POST OP INSTRUCTIONS ?Always review your discharge instruction sheet given to you by the facility where your surgery was performed. ?IF YOU HAVE DISABILITY OR FAMILY LEAVE FORMS, YOU MUST BRING THEM TO THE OFFICE FOR PROCESSING.   ?DO NOT GIVE THEM TO YOUR DOCTOR. ? ?PAIN CONTROL ? ?First take acetaminophen (Tylenol) AND/or ibuprofen (Advil) to control your pain after surgery.  Follow directions on package.  Taking acetaminophen (Tylenol) and/or ibuprofen (Advil) regularly after surgery will help to control your pain and lower the amount of prescription pain medication you may need.  You should not take more than 4,000 mg (4 grams) of acetaminophen (Tylenol) in 24 hours.  You should not take ibuprofen (Advil), aleve, motrin, naprosyn or other NSAIDS if you have a history of stomach ulcers or chronic kidney disease.  ?A prescription for pain medication may be given to you upon discharge.  Take your pain medication as prescribed, if you still have uncontrolled pain after taking acetaminophen (Tylenol) or ibuprofen (Advil). ?Use ice packs to help control pain. ?If you need a refill on your pain medication, please contact your pharmacy.  They will contact our office to request authorization. Prescriptions will not be filled after 5pm or on week-ends. ? ?HOME MEDICATIONS ?Take your usually prescribed medications unless otherwise directed. ? ?DIET ?You should follow a light diet the first few days after arrival home.  Be sure to include lots of fluids daily. Avoid fatty, fried foods.   ? ?CONSTIPATION ?It is common to experience some constipation after surgery and if you are taking pain medication.  Increasing fluid intake and taking a stool softener (such as Colace) will usually help or prevent this problem from occurring.  A mild laxative (Milk of Magnesia or Miralax) should be taken according to package instructions if there are no bowel movements after 48 hours. ? ?WOUND/INCISION CARE ?Most patients will experience some swelling and bruising in the area of the incisions.  Ice packs will help.  Swelling and bruising can take several days to resolve.  ?Unless discharge instructions indicate otherwise, follow guidelines below  ?STERI-STRIPS - you may remove your outer bandages 48 hours after surgery, and you may shower at that time.  You have steri-strips (small skin tapes) in place directly over the incision.  These strips should be left on the skin for 7-10 days.   ?DERMABOND/SKIN GLUE - you may shower in 24 hours.  The glue will flake off over the next 2-3 weeks. ?Any sutures or staples will be removed at the office during your follow-up visit. ? ?ACTIVITIES ?You may resume regular (light) daily activities beginning the next day--such as daily self-care, walking, climbing stairs--gradually increasing activities as tolerated.  You may have sexual intercourse when it is comfortable.  Refrain from any heavy lifting or straining until approved by your doctor. ?You may drive when you are no longer taking prescription pain medication, you can comfortably wear a seatbelt, and you can safely maneuver your car and apply brakes. ? ?FOLLOW-UP ?You should see your doctor in the office for a follow-up appointment  approximately 2-3 weeks after your surgery.  You should have been given your post-op/follow-up appointment when your surgery was scheduled.  If you did not receive a post-op/follow-up appointment, make sure that you call for this appointment within a day or two after you arrive home to insure a  convenient appointment time. ? ? ?WHEN TO CALL YOUR DOCTOR: ?Fever over 101.0 ?Inability to urinate ?Continued bleeding from incision. ?Increased pain, redness, or drainage from the incision. ?Increasing abdominal pain ? ?The clinic staff is available to answer your questions during regular business hours.  Please don?t hesitate to call and ask to speak to one of the nurses for clinical concerns.  If you have a medical emergency, go to the nearest emergency room or call 911.  A surgeon from [facility] is always on call at the hospital. ?[address] [place], [place], [place]  [address] ? [address] [place], [place], [place]   [address] ?[phone] ? [phone] ? FAX [phone] ? ?

## 2021-06-12 NOTE — Anesthesia Preprocedure Evaluation (Addendum)
Anesthesia Evaluation  ?Patient identified by MRN, date of birth, ID band ?Patient awake ? ? ? ?Reviewed: ?Allergy & Precautions, H&P , NPO status , Patient's Chart, lab work & pertinent test results ? ?Airway ?Mallampati: III ? ?TM Distance: >3 FB ?Neck ROM: Full ? ? ? Dental ?no notable dental hx. ?(+) Teeth Intact, Dental Advisory Given,  ?  ?Pulmonary ?asthma , Current Smoker and Patient abstained from smoking., former smoker,  ?  ?Pulmonary exam normal ?breath sounds clear to auscultation ? ? ? ? ? ? Cardiovascular ?negative cardio ROS ? ? ?Rhythm:Regular Rate:Normal ? ? ?  ?Neuro/Psych ?negative neurological ROS ? negative psych ROS  ? GI/Hepatic ?negative GI ROS, Neg liver ROS,   ?Endo/Other  ?Morbid obesity ? Renal/GU ?negative Renal ROS  ?negative genitourinary ?  ?Musculoskeletal ? ? Abdominal ?  ?Peds ? Hematology ?negative hematology ROS ?(+)   ?Anesthesia Other Findings ? ? Reproductive/Obstetrics ?negative OB ROS ? ?  ? ? ? ? ? ? ? ? ? ? ? ? ? ?  ?  ? ? ? ? ? ? ? ?Anesthesia Physical ?Anesthesia Plan ? ?ASA: 2 ? ?Anesthesia Plan: General  ? ?Post-op Pain Management: Ofirmev IV (intra-op)* and Toradol IV (intra-op)*  ? ?Induction: Intravenous ? ?PONV Risk Score and Plan: 3 and Ondansetron, Dexamethasone and Midazolam ? ?Airway Management Planned: Oral ETT ? ?Additional Equipment:  ? ?Intra-op Plan:  ? ?Post-operative Plan: Extubation in OR ? ?Informed Consent: I have reviewed the patients History and Physical, chart, labs and discussed the procedure including the risks, benefits and alternatives for the proposed anesthesia with the patient or authorized representative who has indicated his/her understanding and acceptance.  ? ? ? ?Dental advisory given ? ?Plan Discussed with: CRNA ? ?Anesthesia Plan Comments:   ? ? ? ? ? ?Anesthesia Quick Evaluation ? ?

## 2021-06-12 NOTE — ED Notes (Signed)
RT placed patient on [facility]. SAT 86% on RA ?

## 2021-06-12 NOTE — ED Provider Notes (Signed)
?[facility] EMERGENCY DEPARTMENT ?Provider Note ? ? ?CSN: [id number] ?Arrival date & time: 06/12/21  0543 ? ?  ? ?History ? ?Chief Complaint  ?Patient presents with  ? Abdominal Pain  ? ? ?Angela Ayers is a 41 y.o. female. ? ?The history is provided by the patient and medical records.  ?Abdominal Pain ?Angela Ayers is a 41 y.o. female who presents to the Emergency Department complaining of abdominal pain.  She presents to the emergency department complaining of severe abdominal pain that started at 10 PM last night.  The pain is located in the central epigastric area and is described as a burning sensation.  She developed nausea and vomiting just prior to ED arrival.  No associated fevers, diarrhea, dysuria.  No prior similar symptoms.  She has no known medical problems and takes no medications.  No known bad food exposures.  No tobacco, alcohol, drug use.  She has a history of appendectomy, no additional abdominal surgeries.  Denies any chance of pregnancy. ?  ? ?Home Medications ?Prior to Admission medications   ?Medication Sig Start Date End Date Taking? Authorizing Provider  ?ibuprofen (ADVIL,MOTRIN) 600 MG tablet Take 1 tablet (600 mg total) by mouth every 6 (six) hours as needed. 12/14/15   Julianne Rice, MD  ?ketorolac (TORADOL) 10 MG tablet Take 1 tablet (10 mg total) by mouth every 6 (six) hours as needed for moderate pain. 12/30/19   Corena Herter, PA-C  ?   ? ?Allergies    ?Latex   ? ?Review of Systems   ?Review of Systems  ?Gastrointestinal:  Positive for abdominal pain.  ?All other systems reviewed and are negative. ? ?Physical Exam ?Updated Vital Signs ?BP (!) 165/107   Pulse 67   Temp 97.9 ?F (36.6 ?C) (Oral)   Resp 19   Ht '5\' 5"'$  (1.651 m)   Wt 104.3 kg   LMP 05/28/2021 (Exact Date)   SpO2 97%   BMI 38.27 kg/m?  ?Physical Exam ?Vitals and nursing note reviewed.  ?Constitutional:   ?   General: She is in acute distress.  ?   Appearance: She is well-developed.  ?   Comments: Appears  uncomfortable  ?HENT:  ?   Head: Normocephalic and atraumatic.  ?Cardiovascular:  ?   Rate and Rhythm: Normal rate and regular rhythm.  ?   Heart sounds: No murmur heard. ?Pulmonary:  ?   Effort: Pulmonary effort is normal. No respiratory distress.  ?   Breath sounds: Normal breath sounds.  ?Abdominal:  ?   Palpations: Abdomen is soft.  ?   Tenderness: There is abdominal tenderness. There is no guarding or rebound.  ?   Comments: Moderate epigastric tenderness  ?Musculoskeletal:     ?   General: No tenderness.  ?Skin: ?   General: Skin is warm and dry.  ?Neurological:  ?   Mental Status: She is alert and oriented to person, place, and time.  ?Psychiatric:     ?   Behavior: Behavior normal.  ? ? ?ED Results / Procedures / Treatments   ?Labs ?(all labs ordered are listed, but only abnormal results are displayed) ?Labs Reviewed  ?COMPREHENSIVE METABOLIC PANEL - Abnormal; Notable for the following components:  ?    Result Value  ? Glucose, Bld 129 (*)   ? Calcium 8.7 (*)   ? All other components within normal limits  ?CBC - Abnormal; Notable for the following components:  ? Hemoglobin 10.6 (*)   ? HCT 32.9 (*)   ?  MCV 77.6 (*)   ? MCH 25.0 (*)   ? RDW 16.8 (*)   ? Platelets 418 (*)   ? All other components within normal limits  ?LIPASE, BLOOD  ?URINALYSIS, ROUTINE W REFLEX MICROSCOPIC  ?PREGNANCY, URINE  ?HCG, SERUM, QUALITATIVE  ? ? ?EKG ?EKG Interpretation ? ?Date/Time:  Monday June 12 2021 05:59:51 EDT ?Ventricular Rate:  73 ?PR Interval:  183 ?QRS Duration: 101 ?QT Interval:  386 ?QTC Calculation: 426 ?R Axis:   -12 ?Text Interpretation: Sinus rhythm Baseline wander no prior available for comparison Confirmed by Quintella Reichert [phone]) on 06/12/2021 6:14:05 AM ? ?Radiology ?No results found. ? ?Procedures ?Procedures  ? ? ?Medications Ordered in ED ?Medications  ?fentaNYL (SUBLIMAZE) injection 50 mcg (50 mcg Intravenous Given 06/12/21 0258)  ?sodium chloride 0.9 % bolus 1,000 mL (1,000 mLs Intravenous New Bag/Given  06/12/21 5277)  ?ondansetron [facility]) injection 4 mg (4 mg Intravenous Given 06/12/21 0612)  ?HYDROmorphone (DILAUDID) injection 1 mg (1 mg Intravenous Given 06/12/21 0634)  ? ? ?ED Course/ Medical Decision Making/ A&P ?  ?                        ?Medical Decision Making ?Amount and/or Complexity of Data Reviewed ?Labs: ordered. ?Radiology: ordered. ? ?Risk ?Prescription drug management. ? ? ?Patient here for evaluation of acute onset upper abdominal pain, vomiting prior to ED arrival.  Patient with moderate tenderness on initial evaluation.  Initial concern for internal hernia, pancreatitis.  She was treated with pain meds with improvement in her symptoms.  There was minimal change in her pain after fentanyl but Dilaudid did give her significant pain relief.  On reassessment after medications she does have some persistent upper abdominal pain.  CBC with mild anemia.  CMP without significant abnormality.  Lipase is within normal limits.  Plan to obtain CT scan to further evaluate her symptoms.  We will treat empirically with Protonix for possible gastritis.  Patient care transferred pending urinalysis, advanced imaging. ? ? ? ? ? ? ? ?Final Clinical Impression(s) / ED Diagnoses ?Final diagnoses:  ?None  ? ? ?Rx / DC Orders ?ED Discharge Orders   ? ? None  ? ?  ? ? ?  ?Quintella Reichert, MD ?06/12/21 [phone] ? ?

## 2021-06-13 ENCOUNTER — Encounter (HOSPITAL_COMMUNITY): Payer: Self-pay | Admitting: General Surgery

## 2021-06-13 DIAGNOSIS — K8000 Calculus of gallbladder with acute cholecystitis without obstruction: Secondary | ICD-10-CM | POA: Diagnosis not present

## 2021-06-13 LAB — SURGICAL PATHOLOGY

## 2021-06-13 LAB — HIV ANTIBODY (ROUTINE TESTING W REFLEX): HIV Screen 4th Generation wRfx: NONREACTIVE

## 2021-06-13 MED ORDER — OXYCODONE HCL 5 MG PO TABS: 5.0000 mg | ORAL_TABLET | Freq: Four times a day (QID) | ORAL | 0 refills | Status: DC | PRN

## 2021-06-13 MED ORDER — METHOCARBAMOL 500 MG PO TABS: 500.0000 mg | ORAL_TABLET | Freq: Four times a day (QID) | ORAL | 0 refills | Status: DC | PRN

## 2021-06-13 MED ORDER — ACETAMINOPHEN 500 MG PO TABS
1000.0000 mg | ORAL_TABLET | Freq: Three times a day (TID) | ORAL | 0 refills | Status: AC | PRN
Start: 2021-06-13 — End: ?

## 2021-06-13 MED ORDER — METRONIDAZOLE 500 MG PO TABS: 500.0000 mg | ORAL_TABLET | Freq: Two times a day (BID) | ORAL | 0 refills | Status: DC

## 2021-06-13 NOTE — Discharge Summary (Signed)
? ? ?Patient ID: ?Angela Ayers ?[id number] ?1980/08/31 40 y.o. ? ?Admit date: 06/12/2021 ?Discharge date: 06/13/2021 ? ?Admitting Diagnosis: ?Acute Cholecystitis  ? ?Discharge Diagnosis ?Patient Active Problem List  ? Diagnosis Date Noted  ? Acute cholecystitis 06/12/2021  ?Trichomonas  ? ?Consultants ?None  ? ?Reason for Admission: ?Patient is a 41 year old female who presented to the ED with abdominal pain starting around 10 PM last night. She had spaghetti for dinner. Pain is epigastric and burning. Associated nausea and vomiting. Emesis was just what she had eaten for dinner. Denies fever, chills, chest pain, SOB, diarrhea, urinary symptoms. No previous episodes of similar pain. PMH significant for Hx of asthma, not currently on any inhalers. Prior abdominal surgery includes appendectomy. Allergy to latex. Denies alcohol or illicit drug use. Quit smoking cigarettes in January.  ? ?Procedures ?Dr. Leighton Ruff - Laparoscopic Cholecystectomy - 06/12/2021 ? ?Hospital Course:  ?The patient was admitted and underwent a laparoscopic cholecystectomy.  The patient tolerated the procedure well.  On POD 1, the patient was tolerating a diet, voiding well, mobilizing, and pain was controlled with oral pain medications.  The patient was stable for DC home at this time with appropriate follow up made. Discharge instruction, restrictions and return precautions were discussed. A note was provided for work.  ? ?Patient noted to have Trichomonas present in urine. She reports she was aware of this from the EDP. Denies sexual activity in the last 2 years. Denies any symptoms. She would like to check UA G/C which I have sent and discussed with RN to collect prior to discharge. HIV screening test had already been drawn on routine admission labs. Will send 7 days of flagyl per uptodate recs. Advised not to drink while taking Flagyl. Advised PCP follow up.  ? ?Physical Exam: ?Gen:  Alert, NAD, pleasant ?HEENT: EOM's intact, pupils equal  and round ?Card:  Reg rate and rhythm on my exam ?Pulm:  CTAB, no W/R/R, effort normal ?Abd: Soft, ND, appropriately tender around laparoscopic incisions - otherwise NT without peritonitis. +BS. Incisions with glue intact appears well and are without drainage, bleeding, or signs of infection ?Ext:  No LE edema  ?Psych: A&Ox3  ?Skin: no rashes noted, warm and dry  ? ?Allergies as of 06/13/2021   ? ?   Reactions  ? Latex   ? hives  ? ?  ? ?  ?Medication List  ?  ? ?TAKE these medications   ? ?acetaminophen 500 MG tablet ?Commonly known as: TYLENOL ?Take 2 tablets (1,000 mg total) by mouth every 8 (eight) hours as needed. ?  ?methocarbamol 500 MG tablet ?Commonly known as: ROBAXIN ?Take 1 tablet (500 mg total) by mouth every 6 (six) hours as needed for muscle spasms. ?  ?metroNIDAZOLE 500 MG tablet ?Commonly known as: Flagyl ?Take 1 tablet (500 mg total) by mouth 2 (two) times daily with a meal. DO NOT CONSUME ALCOHOL WHILE TAKING THIS MEDICATION. ?  ?oxyCODONE 5 MG immediate release tablet ?Commonly known as: Oxy IR/ROXICODONE ?Take 1 tablet (5 mg total) by mouth every 6 (six) hours as needed for breakthrough pain. ?  ? ?  ? ? ? ? Follow-up Information   ? ? [facility].   ?Specialty: General Surgery ?Why: Please call to confirm your appointment date and time. We are working hard to make this for you. Please bring a copy of your photo ID and insurance card. Please arrive 30 minutes prior to your appoitnment for paperwork. ?Contact information: ?  [place] [address] ?[phone] ? ? ?  ?  ? ? [facility] Follow up.   ?Specialty: Pediatrics ?Why: For follow up ?Contact information: ?[address] ?[place] [address] ?[phone] ? ? ?  ?  ? ?  ?  ? ?  ? ? ?Signed: ?Alferd Apa, PA-C ?[facility] ?06/13/2021, 11:19 AM ?Please see Amion for pager number during day hours 7:00am-4:30pm ? ?

## 2021-06-13 NOTE — Plan of Care (Signed)
Instructions were reviewed with patient. All questions were answered. Patient was transported to main entrance by wheelchair. ° °

## 2021-06-13 NOTE — Progress Notes (Signed)
Transition of Care (TOC) Screening Note ? ?Patient Details  ?Name: Angela Ayers ?Date of Birth: 09-10-1980 ? ?Transition of Care (TOC) CM/SW Contact:    ?Sherie Don, LCSW ?Phone Number: ?06/13/2021, 9:48 AM ? ?Transition of Care Department [facility]) has reviewed patient and no TOC needs have been identified at this time. We will continue to monitor patient advancement through interdisciplinary progression rounds. If new patient transition needs arise, please place a TOC consult. ?

## 2021-06-14 LAB — GC/CHLAMYDIA PROBE AMP (~~LOC~~) NOT AT ARMC
Chlamydia: NEGATIVE
Comment: NEGATIVE
Comment: NORMAL
Neisseria Gonorrhea: NEGATIVE

## 2021-06-25 ENCOUNTER — Observation Stay (HOSPITAL_COMMUNITY): Payer: Medicare Other

## 2021-06-25 ENCOUNTER — Other Ambulatory Visit: Payer: Self-pay

## 2021-06-25 ENCOUNTER — Inpatient Hospital Stay (HOSPITAL_BASED_OUTPATIENT_CLINIC_OR_DEPARTMENT_OTHER)
Admission: EM | Admit: 2021-06-25 | Discharge: 2021-06-29 | DRG: 392 | Disposition: A | Discharge status: Home / Self Care | Payer: Medicare Other | Attending: General Surgery | Admitting: General Surgery

## 2021-06-25 ENCOUNTER — Emergency Department (HOSPITAL_BASED_OUTPATIENT_CLINIC_OR_DEPARTMENT_OTHER): Payer: Medicare Other

## 2021-06-25 ENCOUNTER — Encounter (HOSPITAL_BASED_OUTPATIENT_CLINIC_OR_DEPARTMENT_OTHER): Payer: Self-pay | Admitting: Emergency Medicine

## 2021-06-25 DIAGNOSIS — E669 Obesity, unspecified: Secondary | ICD-10-CM | POA: Diagnosis present

## 2021-06-25 DIAGNOSIS — R7989 Other specified abnormal findings of blood chemistry: Secondary | ICD-10-CM | POA: Diagnosis present

## 2021-06-25 DIAGNOSIS — G8918 Other acute postprocedural pain: Principal | ICD-10-CM

## 2021-06-25 DIAGNOSIS — R109 Unspecified abdominal pain: Secondary | ICD-10-CM | POA: Diagnosis present

## 2021-06-25 DIAGNOSIS — Z6838 Body mass index (BMI) 38.0-38.9, adult: Secondary | ICD-10-CM

## 2021-06-25 DIAGNOSIS — Z9049 Acquired absence of other specified parts of digestive tract: Secondary | ICD-10-CM

## 2021-06-25 DIAGNOSIS — R1011 Right upper quadrant pain: Principal | ICD-10-CM | POA: Diagnosis present

## 2021-06-25 DIAGNOSIS — J45909 Unspecified asthma, uncomplicated: Secondary | ICD-10-CM | POA: Diagnosis present

## 2021-06-25 DIAGNOSIS — K810 Acute cholecystitis: Secondary | ICD-10-CM

## 2021-06-25 DIAGNOSIS — Z87891 Personal history of nicotine dependence: Secondary | ICD-10-CM

## 2021-06-25 LAB — HEPATIC FUNCTION PANEL
ALT: 331 U/L — ABNORMAL HIGH (ref 0–44)
AST: 663 U/L — ABNORMAL HIGH (ref 15–41)
Albumin: 3.5 g/dL (ref 3.5–5.0)
Alkaline Phosphatase: 133 U/L — ABNORMAL HIGH (ref 38–126)
Bilirubin, Direct: 0.9 mg/dL — ABNORMAL HIGH (ref 0.0–0.2)
Indirect Bilirubin: 0.7 mg/dL (ref 0.3–0.9)
Total Bilirubin: 1.6 mg/dL — ABNORMAL HIGH (ref 0.3–1.2)
Total Protein: 7.2 g/dL (ref 6.5–8.1)

## 2021-06-25 LAB — COMPREHENSIVE METABOLIC PANEL
ALT: 219 U/L — ABNORMAL HIGH (ref 0–44)
AST: 563 U/L — ABNORMAL HIGH (ref 15–41)
Albumin: 3.9 g/dL (ref 3.5–5.0)
Alkaline Phosphatase: 118 U/L (ref 38–126)
Anion gap: 9 (ref 5–15)
BUN: 9 mg/dL (ref 6–20)
CO2: 22 mmol/L (ref 22–32)
Calcium: 8.7 mg/dL — ABNORMAL LOW (ref 8.9–10.3)
Chloride: 105 mmol/L (ref 98–111)
Creatinine, Ser: 0.76 mg/dL (ref 0.44–1.00)
GFR, Estimated: >60 mL/min (ref >60)
Glucose, Bld: 113 mg/dL — ABNORMAL HIGH (ref 70–99)
Potassium: 3.6 mmol/L (ref 3.5–5.1)
Sodium: 136 mmol/L (ref 135–145)
Total Bilirubin: 1.1 mg/dL (ref 0.3–1.2)
Total Protein: 7.6 g/dL (ref 6.5–8.1)

## 2021-06-25 LAB — CBC WITH DIFFERENTIAL/PLATELET
Abs Immature Granulocytes: 0.01 10*3/uL (ref 0.00–0.07)
Basophils Absolute: 0 10*3/uL (ref 0.0–0.1)
Basophils Relative: 1 %
Eosinophils Absolute: 0.2 10*3/uL (ref 0.0–0.5)
Eosinophils Relative: 5 %
HCT: 35 % — ABNORMAL LOW (ref 36.0–46.0)
Hemoglobin: 11.5 g/dL — ABNORMAL LOW (ref 12.0–15.0)
Immature Granulocytes: 0 %
Lymphocytes Relative: 28 %
Lymphs Abs: 1.4 10*3/uL (ref 0.7–4.0)
MCH: 25 pg — ABNORMAL LOW (ref 26.0–34.0)
MCHC: 32.9 g/dL (ref 30.0–36.0)
MCV: 76.1 fL — ABNORMAL LOW (ref 80.0–100.0)
Monocytes Absolute: 0.5 10*3/uL (ref 0.1–1.0)
Monocytes Relative: 9 %
Neutro Abs: 2.8 10*3/uL (ref 1.7–7.7)
Neutrophils Relative %: 57 %
Platelets: 427 10*3/uL — ABNORMAL HIGH (ref 150–400)
RBC: 4.6 MIL/uL (ref 3.87–5.11)
RDW: 16.7 % — ABNORMAL HIGH (ref 11.5–15.5)
WBC: 4.9 10*3/uL (ref 4.0–10.5)
nRBC: 0 % (ref 0.0–0.2)

## 2021-06-25 LAB — LIPASE, BLOOD: Lipase: 22 U/L (ref 11–51)

## 2021-06-25 IMAGING — CT CT ABD-PELV W/ CM
2 of 5 series · 16 of 46 positions shown, 18 images · IV contrast (Omnipaque)
Comparison: [DATE].

CLINICAL DATA: Cholecystectomy on [DATE]. Worsening abdominal
pain.

EXAM:
CT ABDOMEN AND PELVIS WITH CONTRAST
TECHNIQUE: Multidetector CT imaging of the abdomen and pelvis was performed
using the standard protocol following bolus administration of
intravenous contrast.

[Series 2: axial st · axial · 0.81mm/px · z∈[+742,+1157]mm · 13 of 93 slices shown, 15 images]
[im 5/93  soft-tissue]
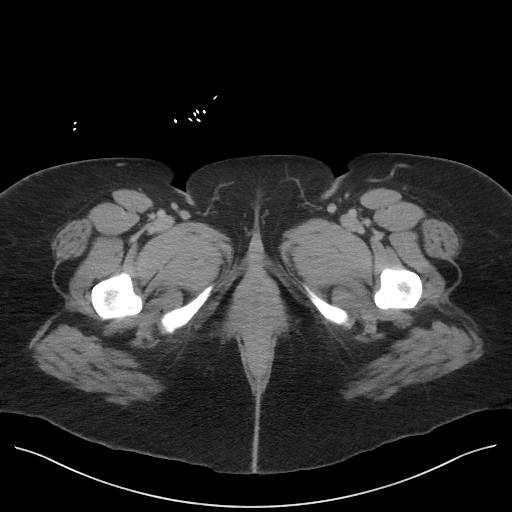
[im 5/93  bone]
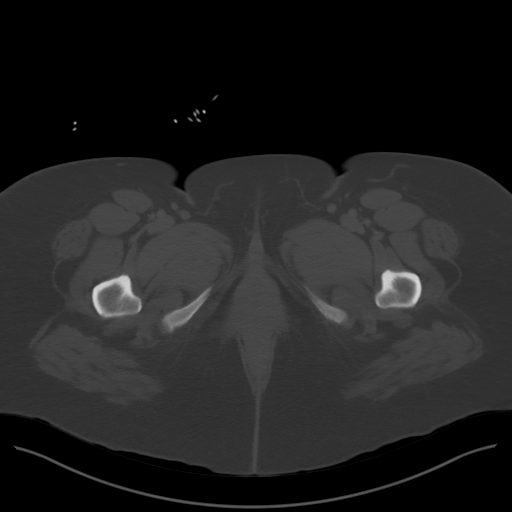
[im 14/93  soft-tissue]
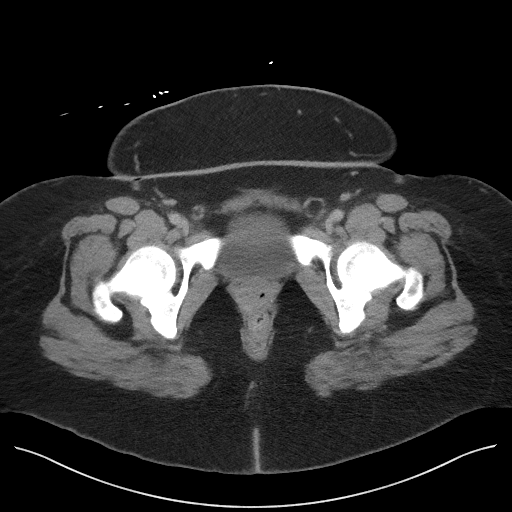
[im 18/93  soft-tissue]
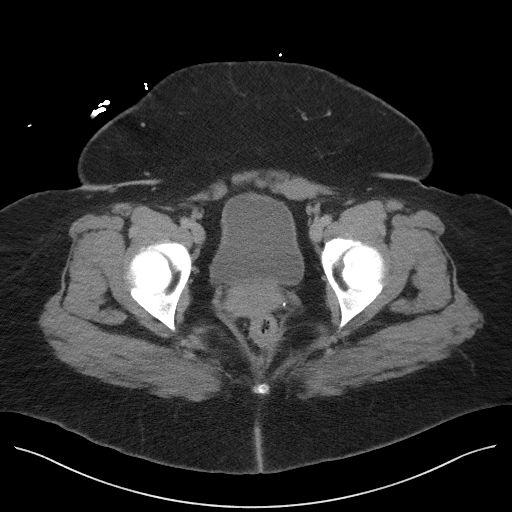
[im 27/93  soft-tissue]
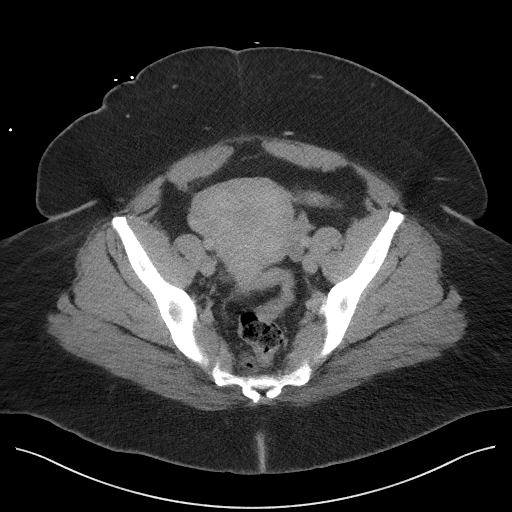
[im 31/93  soft-tissue]
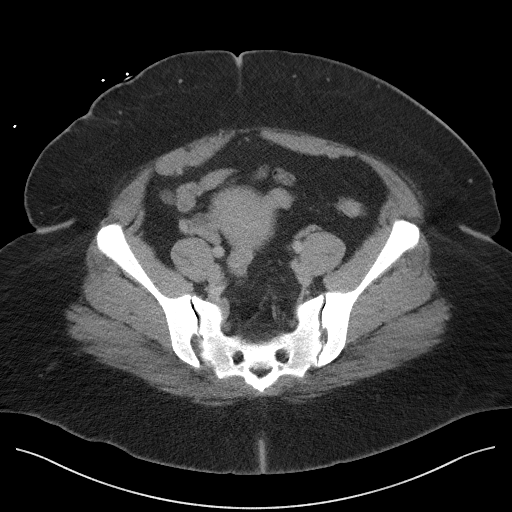
[im 40/93  soft-tissue]
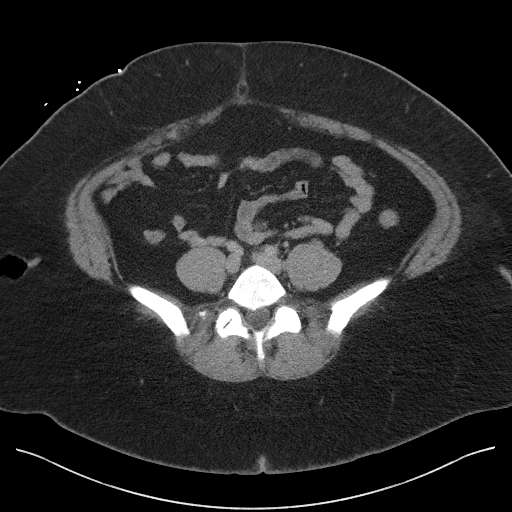
[im 49/93  soft-tissue]
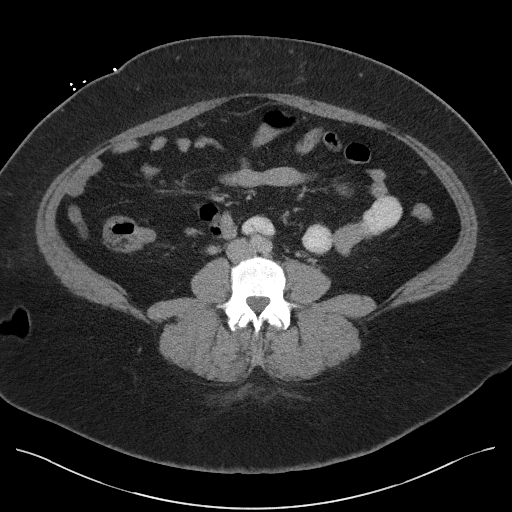
[im 53/93  soft-tissue]
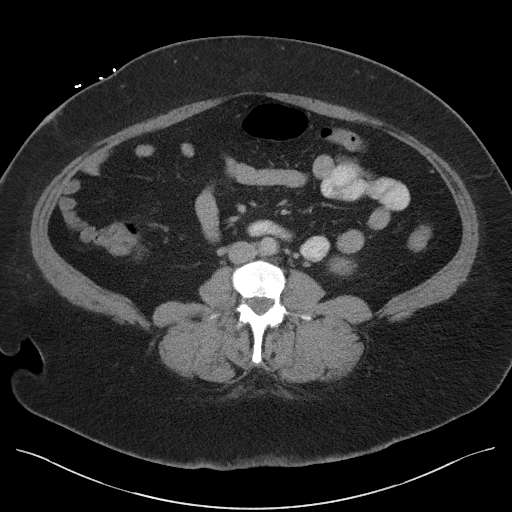
[im 62/93  soft-tissue]
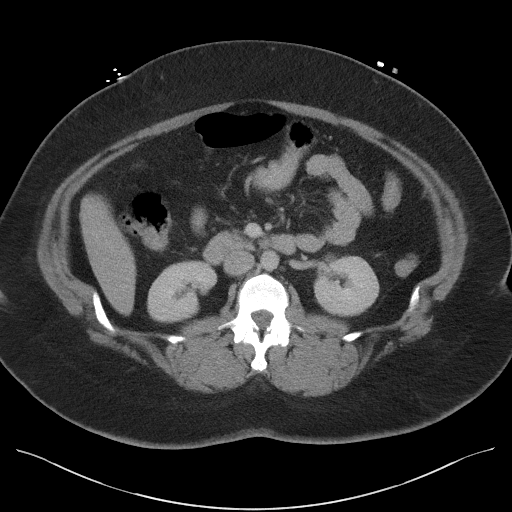
[im 62/93  bone]
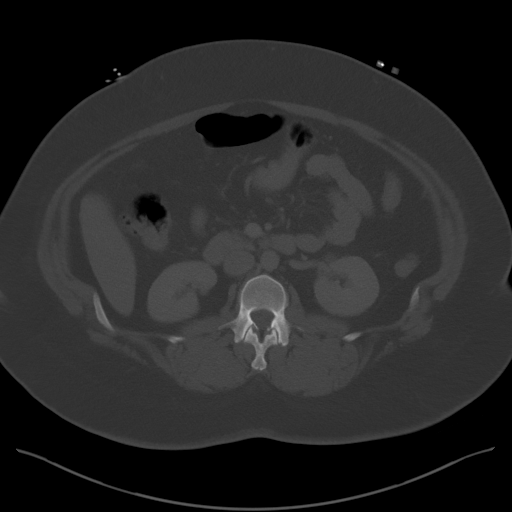
[im 66/93  soft-tissue]
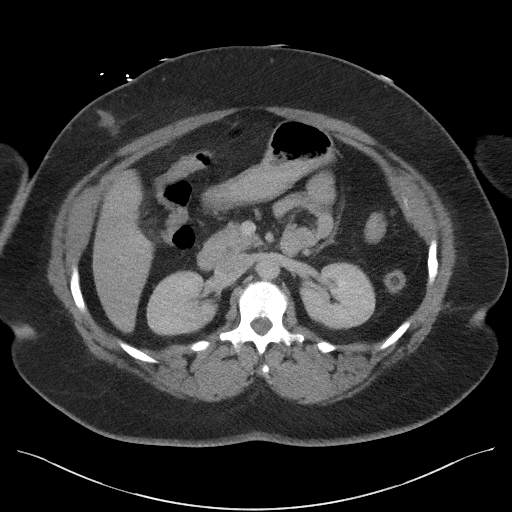
[im 75/93  soft-tissue]
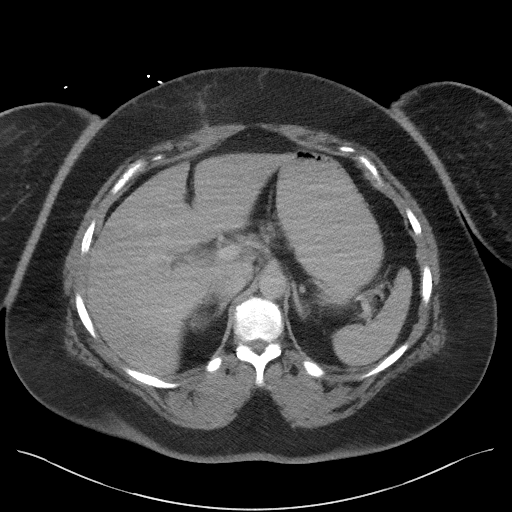
[im 79/93  soft-tissue]
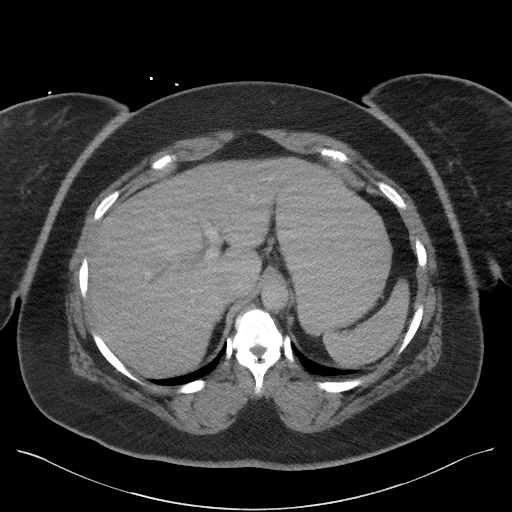
[im 88/93  soft-tissue]
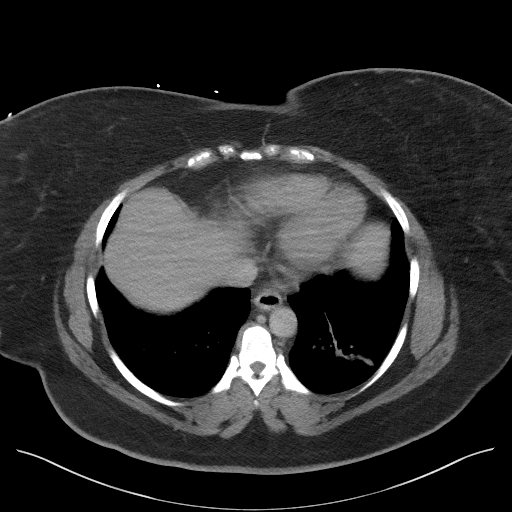

[Series 5: coronal st · coronal · 0.90mm/px · 3 of 105 slices shown]
[im 35/105  soft-tissue]
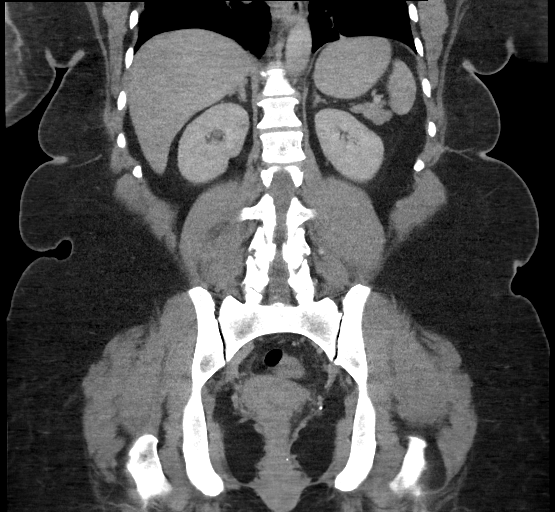
[im 47/105  soft-tissue]
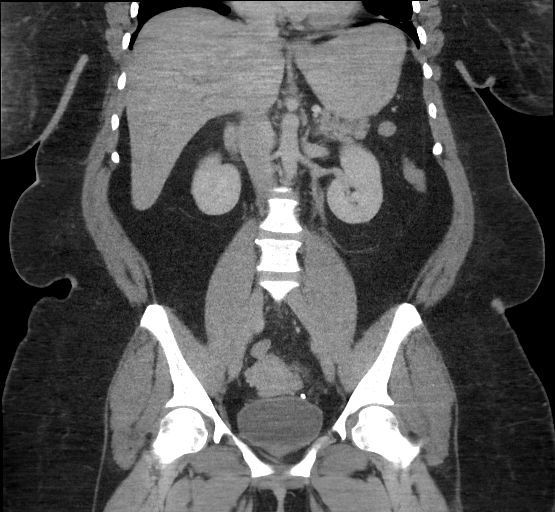
[im 58/105  soft-tissue]
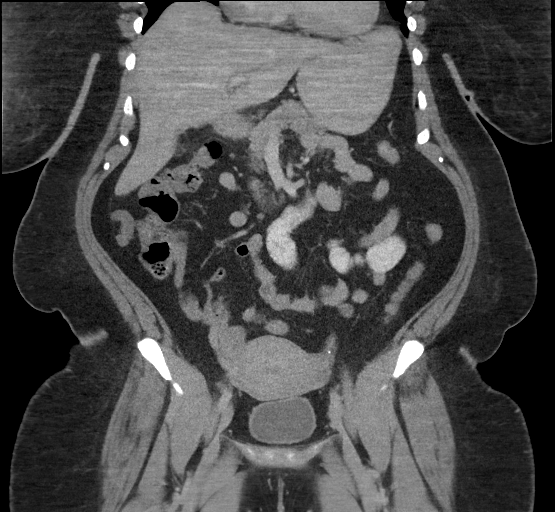

[16 of 46 positions shown; findings below may reference images not displayed]

RADIATION DOSE REDUCTION: This exam was performed according to the
departmental dose-optimization program which includes automated
exposure control, adjustment of the mA and/or kV according to
patient size and/or use of iterative reconstruction technique.

CONTRAST:  100mL OMNIPAQUE IOHEXOL 300 MG/ML  SOLN
FINDINGS: Lower chest: Subsegmental atelectasis noted left lower lobe.

Hepatobiliary: No suspicious focal abnormality within the liver
parenchyma. Gallbladder surgically absent. Trace fluid noted in the
gallbladder fossa. Possible edema in the hepatoduodenal ligament. No
biliary dilatation.

Pancreas: No focal mass lesion. No dilatation of the main duct. No
intraparenchymal cyst. No peripancreatic edema.

Spleen: No splenomegaly. No focal mass lesion.

Adrenals/Urinary Tract: No adrenal nodule or mass. Kidneys
unremarkable. No evidence for hydroureter. The urinary bladder
appears normal for the degree of distention.

Stomach/Bowel: Stomach is moderately distended with fluid and
contrast. Duodenum is normally positioned as is the ligament of
Treitz. No small bowel wall thickening. No small bowel dilatation.
The terminal ileum is normal. The appendix is not well visualized,
but there is no edema or inflammation in the region of the cecum.
Colon is diffusely decompressed.

Vascular/Lymphatic: No abdominal aortic aneurysm. No abdominal
lymphadenopathy. No pelvic sidewall lymphadenopathy.

Reproductive: The uterus is unremarkable.  There is no adnexal mass.

Other: No intraperitoneal free fluid.

Musculoskeletal: No worrisome lytic or sclerotic osseous
abnormality.
IMPRESSION: 1. Status post cholecystectomy with trace fluid in the gallbladder
fossa, consistent with recent surgery. Possible trace edema in the
hepatoduodenal ligament. No biliary dilatation. No evidence for
abscess.
2. Otherwise unremarkable exam.

## 2021-06-25 IMAGING — MR MR 3D RECON AT [facility]
17 of 21 series · 38 of 48 positions shown · IV contrast (10 ML GADAVIST)
Comparison: CT AP [DATE]

CLINICAL DATA: Evaluate for choledocholithiasis.

EXAM:
MRI ABDOMEN WITHOUT AND WITH CONTRAST (INCLUDING MRCP)
TECHNIQUE: Multiplanar multisequence MR imaging of the abdomen was performed
both before and after the administration of intravenous contrast.
Heavily T2-weighted images of the biliary and pancreatic ducts were
obtained, and three-dimensional MRCP images were rendered by post
processing.
CONTRAST:  10mL GADAVIST GADOBUTROL 1 MMOL/ML IV SOLN

[Series 3: T2 fat-sat · axial · 6.0mm · 1.25mm/px · 1 of 36 slices shown]
[im 1/36]
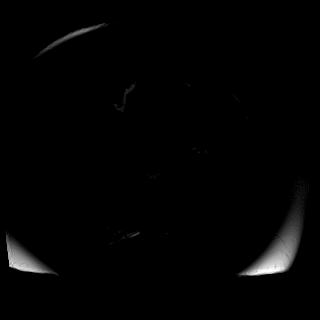

[Series 6: T2 · coronal · 6.0mm · 1.56mm/px · 1 of 34 slices shown (1 of 2)]
[im 1/34]
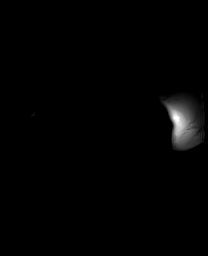

[Series 7: DWI · axial · 6.0mm · 1.49mm/px · z∈[-101,+151]mm · 2 of 72 slices shown (1 of 2)]
[im 1/72]
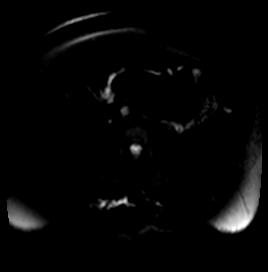
[im 72/72]
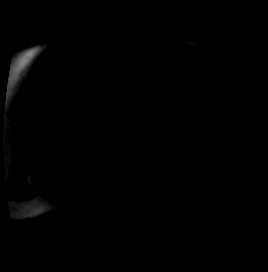

[Series 8: DWI · axial · 6.0mm · 1.49mm/px · 1 of 36 slices shown (2 of 2)]
[im 1/36]
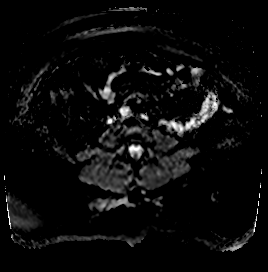

[Series 9: T1 · axial · 3.0mm · 1.25mm/px · z∈[-94,+143]mm · 3 of 80 slices shown (1 of 2)]
[im 1/80]
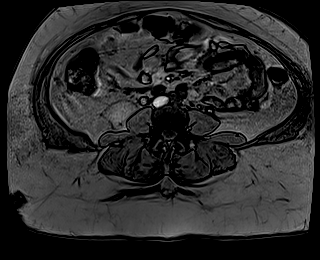
[im 40/80]
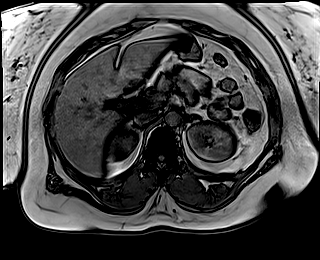
[im 80/80]
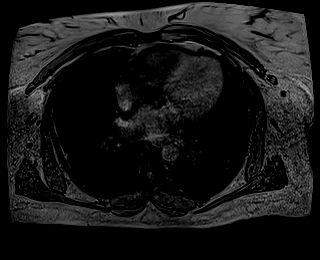

[Series 10: T1 · axial · 3.0mm · 1.25mm/px · z∈[-94,+143]mm · 3 of 80 slices shown (2 of 2)]
[im 1/80]
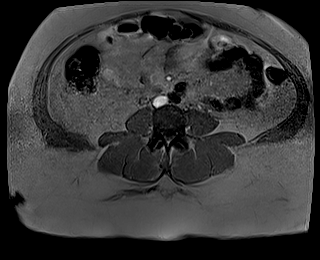
[im 40/80]
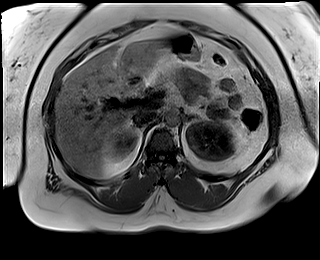
[im 80/80]
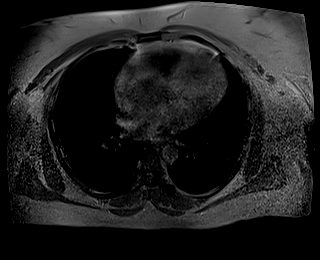

[Series 11: cor obl thk · sagittal · 50.0mm · 0.78mm/px · 1 of 9 slices shown]
[im 1/9]
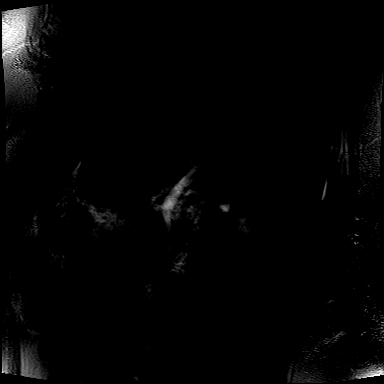

[Series 13: cor_3d_spc_trig · coronal · 1.0mm · 0.49mm/px · 3 of 72 slices shown]
[im 1/72]
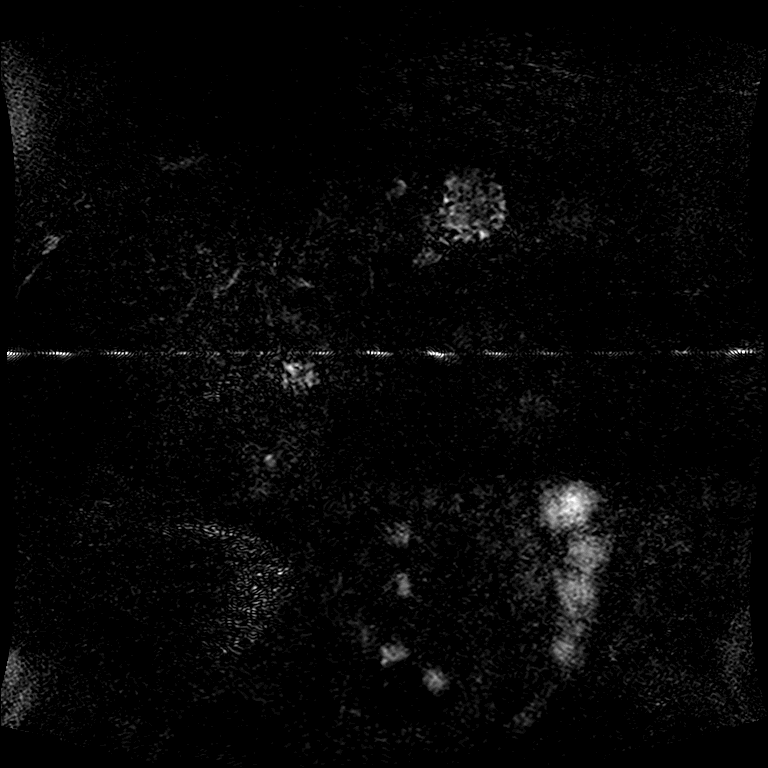
[im 36/72]
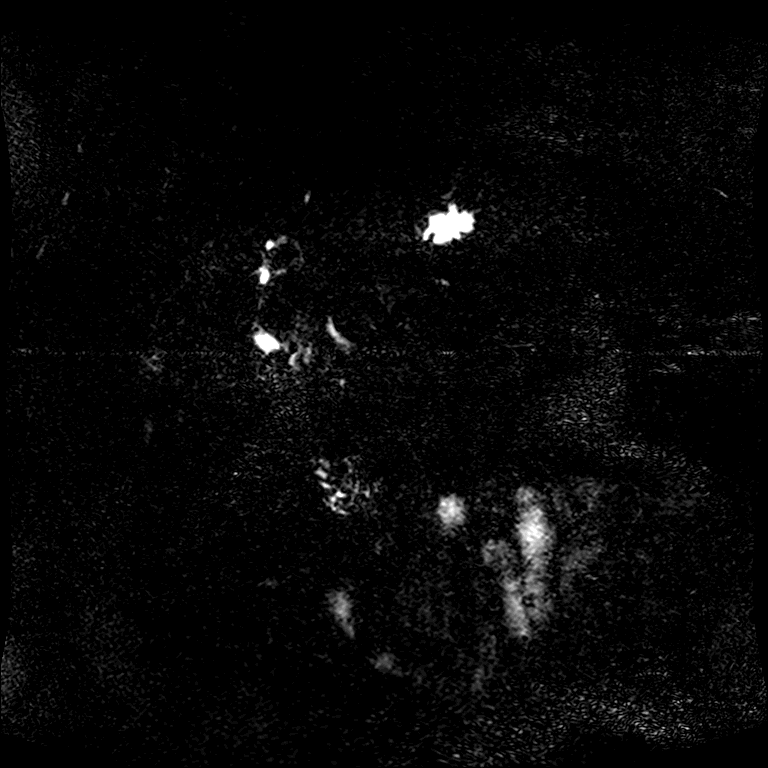
[im 72/72]
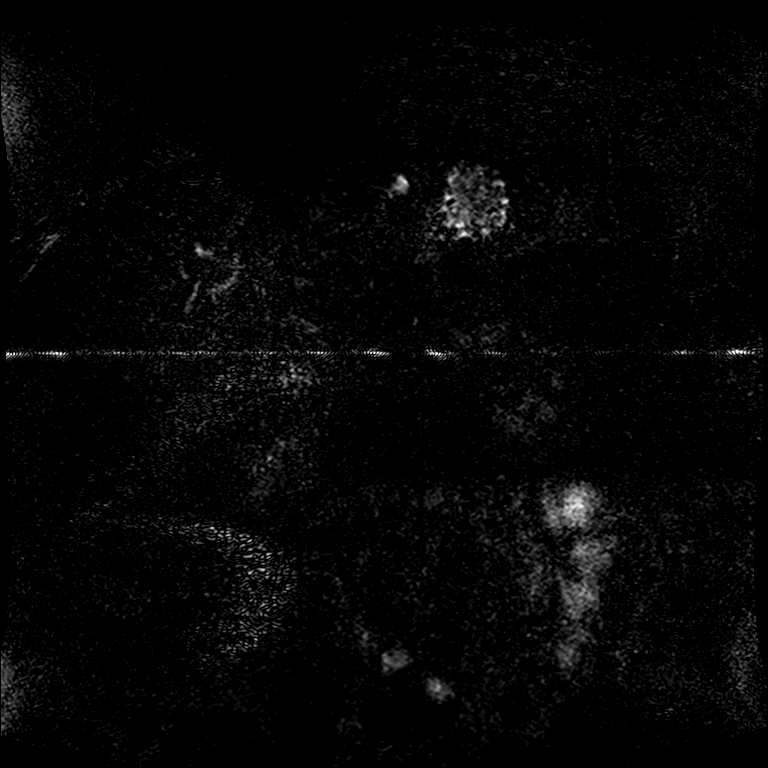

[Series 15: T2 · axial · 6.0mm · 1.56mm/px · 1 of 36 slices shown (2 of 2)]
[im 1/36]
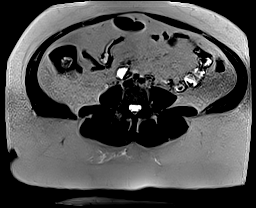

[Series 17: T1 dynamic · axial · 3.0mm · 1.25mm/px · z∈[-93,+144]mm · 3 of 80 slices shown (1 of 8)]
[im 1/80]
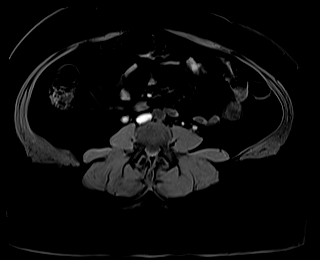
[im 40/80]
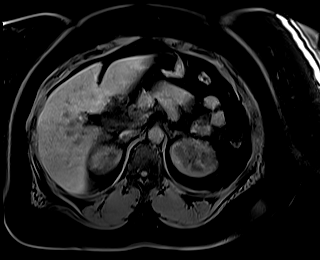
[im 80/80]
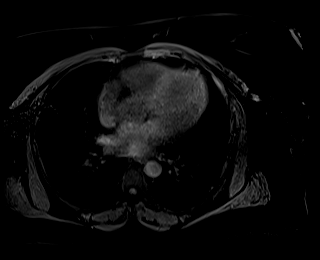

[Series 21: T1 dynamic · axial · 3.0mm · 1.25mm/px · z∈[-93,+144]mm · 3 of 80 slices shown (2 of 8)]
[im 1/80]
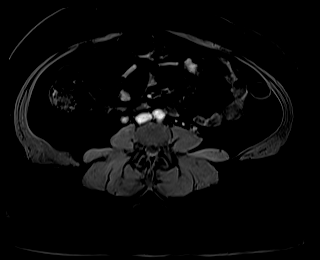
[im 40/80]
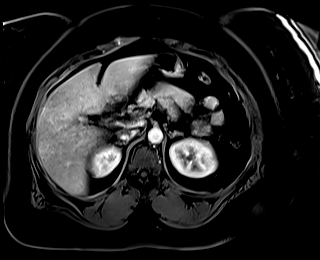
[im 80/80]
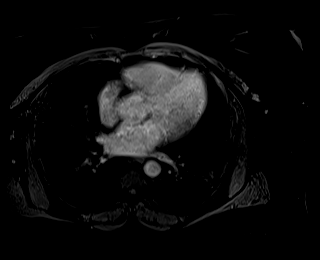

[Series 22: T1 dynamic · axial · 3.0mm · 1.25mm/px · z∈[-93,+144]mm · 3 of 80 slices shown (3 of 8)]
[im 1/80]
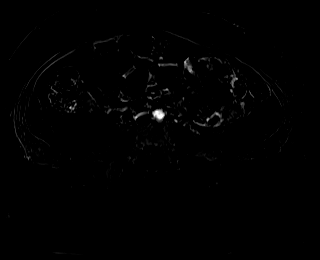
[im 40/80]
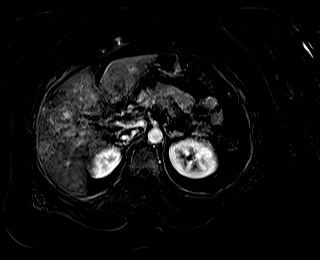
[im 80/80]
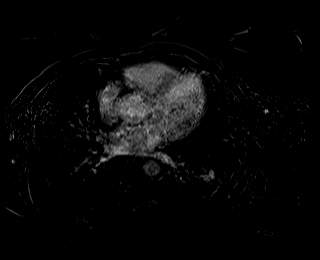

[Series 25: T1 dynamic · axial · 3.0mm · 1.25mm/px · z∈[-93,+144]mm · 3 of 80 slices shown (4 of 8)]
[im 1/80]
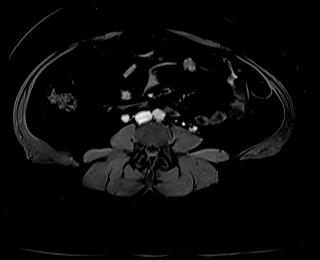
[im 40/80]
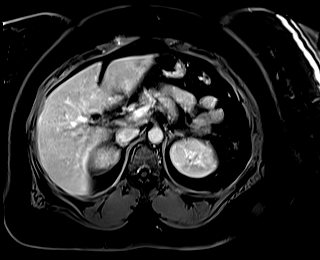
[im 80/80]
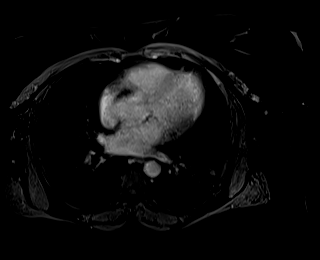

[Series 26: T1 dynamic · axial · 3.0mm · 1.25mm/px · z∈[-93,+144]mm · 3 of 80 slices shown (5 of 8)]
[im 1/80]
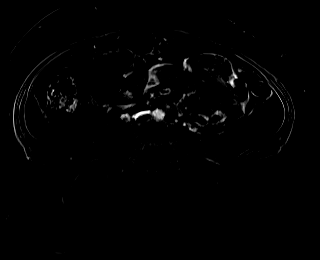
[im 40/80]
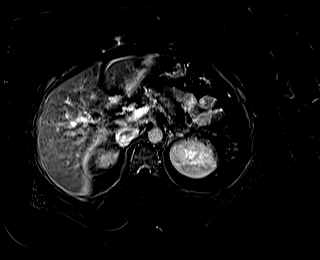
[im 80/80]
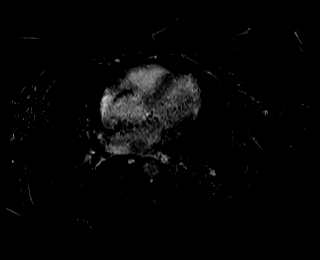

[Series 29: T1 dynamic · axial · 3.0mm · 1.25mm/px · z∈[-93,+144]mm · 3 of 80 slices shown (6 of 8)]
[im 1/80]
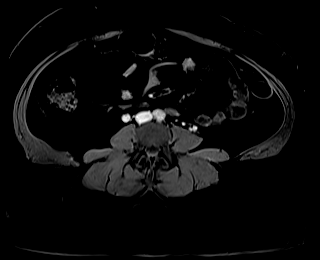
[im 40/80]
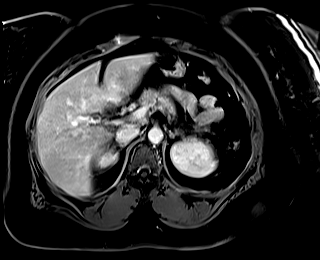
[im 80/80]
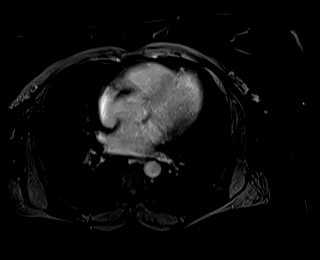

[Series 30: T1 dynamic · axial · 3.0mm · 1.25mm/px · z∈[-93,+144]mm · 3 of 80 slices shown (7 of 8)]
[im 1/80]
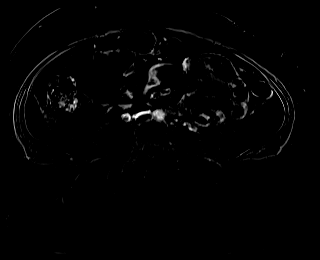
[im 40/80]
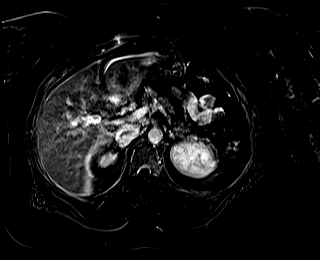
[im 80/80]
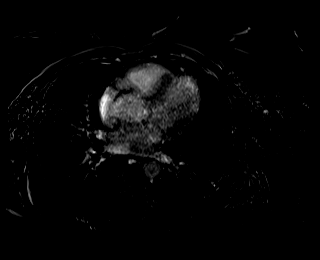

[Series 32: T1 dynamic · coronal · 3.0mm · 1.41mm/px · 1 of 72 slices shown (8 of 8)]
[im 1/72]
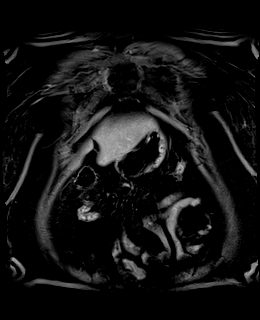

[38 of 48 positions shown; findings below may reference images not displayed]

FINDINGS: Lower chest: Subsegmental atelectasis noted within the left lower
lobe. No pleural effusion.

Hepatobiliary: No focal suspicious enhancing liver abnormality.
Status post cholecystectomy. The common bile duct measures 7 mm in
maximum diameter. No signs of choledocholithiasis.

Pancreas: No mass, inflammatory changes, or other parenchymal
abnormality identified.

Spleen:  Within normal limits in size and appearance.

Adrenals/Urinary Tract: Normal adrenal glands. Simple appearing cyst
within upper pole of right kidney measures 1.1 x 0.7 cm and is
compatible with a benign Bosniak class 1 cyst. No follow-up
recommended. No suspicious mass or hydronephrosis identified
bilaterally.

Stomach/Bowel: Stomach appears normal. No dilated loops of large or
small bowel.

Vascular/Lymphatic: Aortic atherosclerosis without aneurysm. No
abdominopelvic adenopathy.

Other: There is no free fluid or fluid collections identified within
the abdomen or pelvis. Fluid signal intensity within the ventral
abdominal wall overlying the liver likely represents postoperative
change from laparoscopic port.

Musculoskeletal: No suspicious bone lesions identified.
IMPRESSION: 1. No acute findings within the abdomen.
2. Status post cholecystectomy. No evidence of choledocholithiasis.

## 2021-06-25 IMAGING — MR MR ABDOMEN WO/W CM MRCP
17 of 22 series · 38 of 48 positions shown · IV contrast (gadavist)
Comparison: CT AP [DATE]

CLINICAL DATA: Evaluate for choledocholithiasis.

EXAM:
MRI ABDOMEN WITHOUT AND WITH CONTRAST (INCLUDING MRCP)
TECHNIQUE: Multiplanar multisequence MR imaging of the abdomen was performed
both before and after the administration of intravenous contrast.
Heavily T2-weighted images of the biliary and pancreatic ducts were
obtained, and three-dimensional MRCP images were rendered by post
processing.
CONTRAST:  10mL GADAVIST GADOBUTROL 1 MMOL/ML IV SOLN

[Series 3: T2 fat-sat · axial · 6.0mm · 1.25mm/px · 1 of 36 slices shown]
[im 1/36]
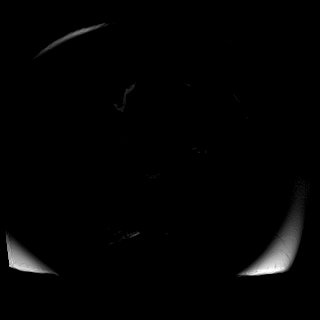

[Series 6: T2 · coronal · 6.0mm · 1.56mm/px · 1 of 34 slices shown (1 of 2)]
[im 1/34]
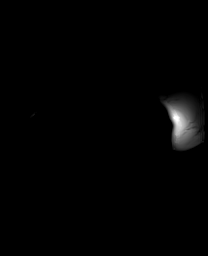

[Series 7: DWI · axial · 6.0mm · 1.49mm/px · z∈[-101,+151]mm · 2 of 72 slices shown (1 of 2)]
[im 1/72]
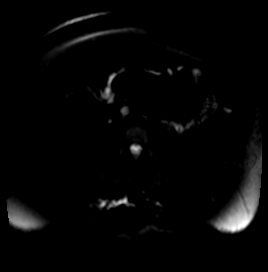
[im 72/72]
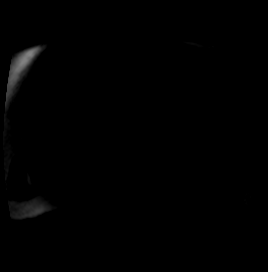

[Series 8: DWI · axial · 6.0mm · 1.49mm/px · 1 of 36 slices shown (2 of 2)]
[im 1/36]
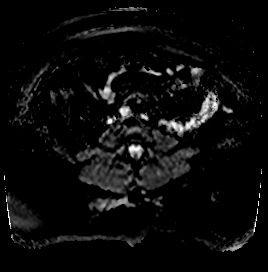

[Series 9: T1 · axial · 3.0mm · 1.25mm/px · z∈[-94,+143]mm · 2 of 80 slices shown (1 of 2)]
[im 1/80]
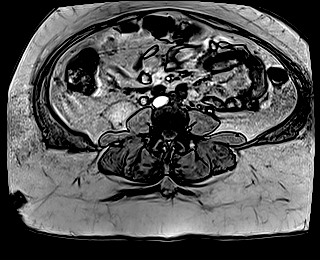
[im 80/80]
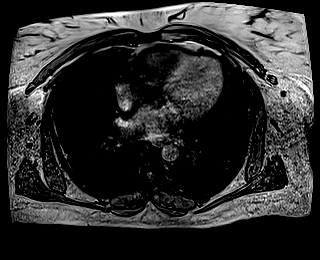

[Series 10: T1 · axial · 3.0mm · 1.25mm/px · z∈[-94,+143]mm · 3 of 80 slices shown (2 of 2)]
[im 1/80]
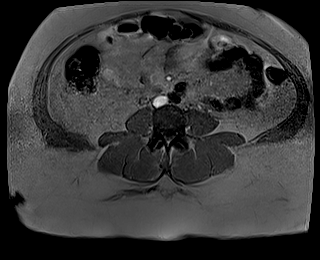
[im 40/80]
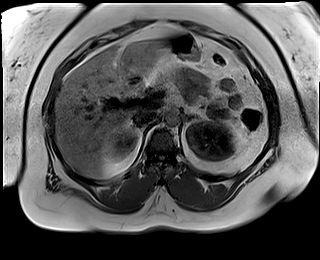
[im 80/80]
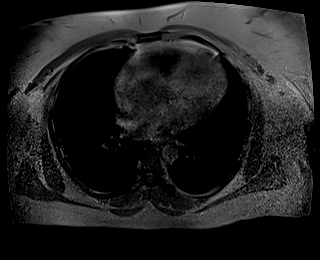

[Series 11: cor obl thk · sagittal · 50.0mm · 0.78mm/px · 1 of 9 slices shown]
[im 1/9]
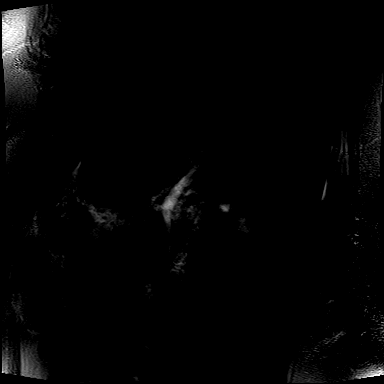

[Series 13: cor_3d_spc_trig · coronal · 1.0mm · 0.49mm/px · 3 of 72 slices shown]
[im 1/72]
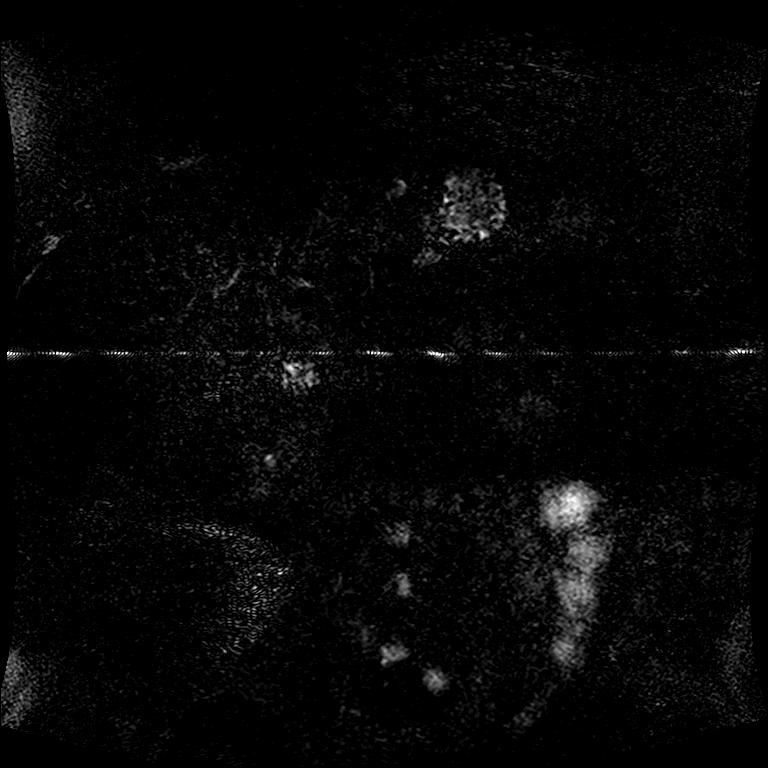
[im 36/72]
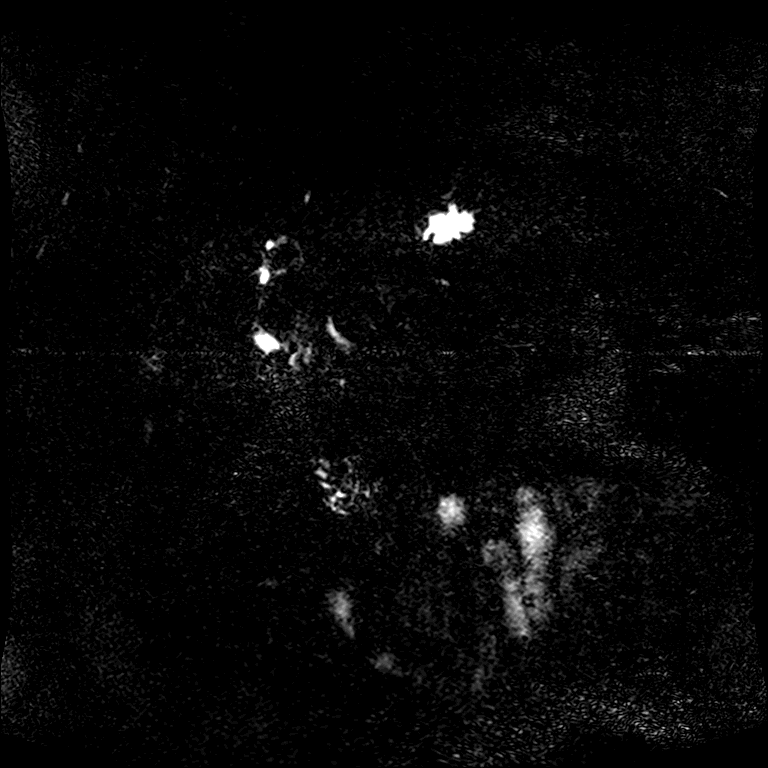
[im 72/72]
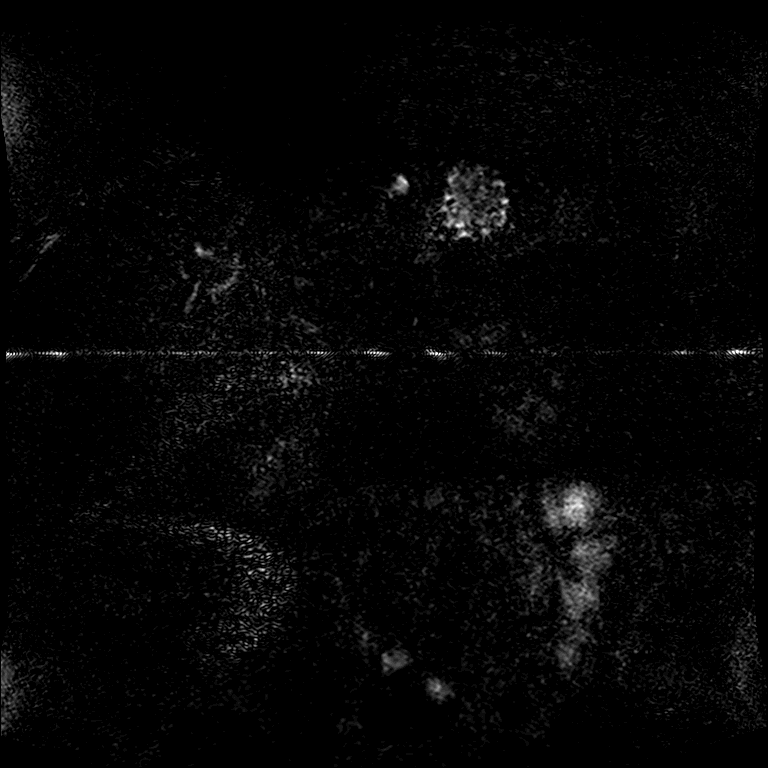

[Series 15: T2 · axial · 6.0mm · 1.56mm/px · 1 of 36 slices shown (2 of 2)]
[im 1/36]
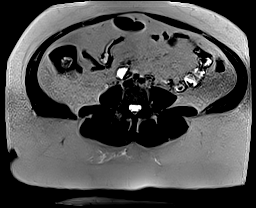

[Series 17: T1 dynamic · axial · 3.0mm · 1.25mm/px · z∈[-93,+144]mm · 3 of 80 slices shown (1 of 8)]
[im 1/80]
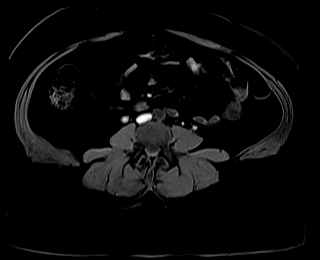
[im 40/80]
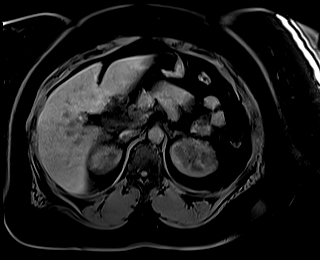
[im 80/80]
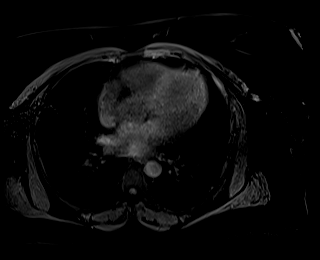

[Series 21: T1 dynamic · axial · 3.0mm · 1.25mm/px · z∈[-93,+144]mm · 3 of 80 slices shown (2 of 8)]
[im 1/80]
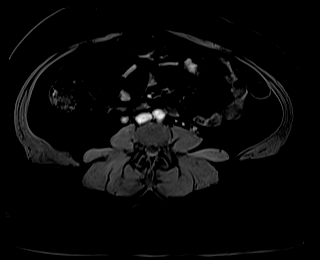
[im 40/80]
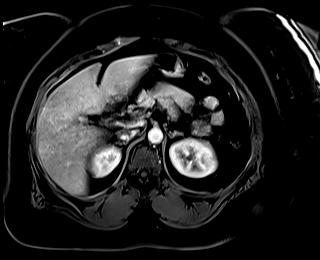
[im 80/80]
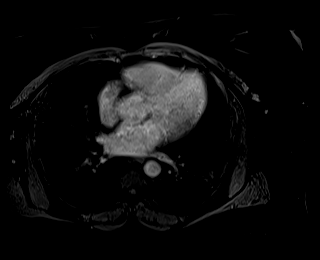

[Series 22: T1 dynamic · axial · 3.0mm · 1.25mm/px · z∈[-93,+144]mm · 3 of 80 slices shown (3 of 8)]
[im 1/80]
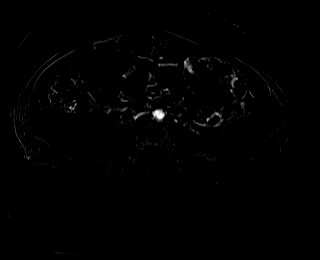
[im 40/80]
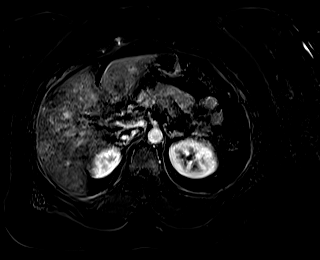
[im 80/80]
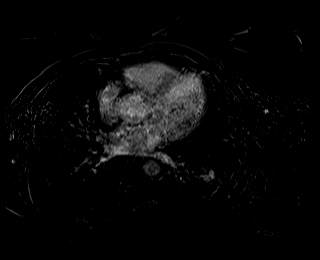

[Series 25: T1 dynamic · axial · 3.0mm · 1.25mm/px · z∈[-93,+144]mm · 3 of 80 slices shown (4 of 8)]
[im 1/80]
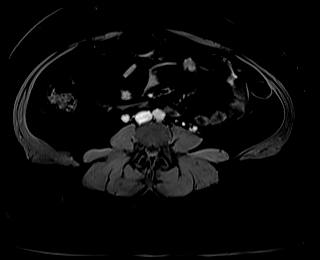
[im 40/80]
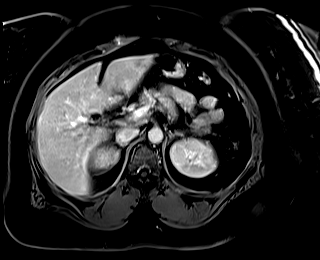
[im 80/80]
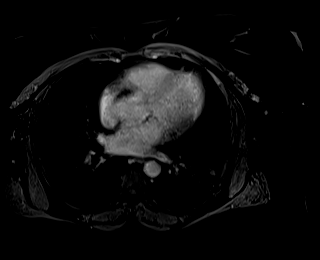

[Series 26: T1 dynamic · axial · 3.0mm · 1.25mm/px · z∈[-93,+144]mm · 3 of 80 slices shown (5 of 8)]
[im 1/80]
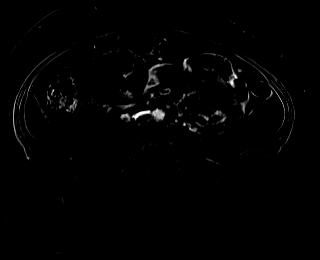
[im 40/80]
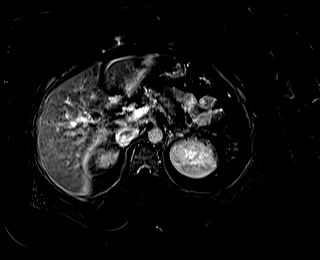
[im 80/80]
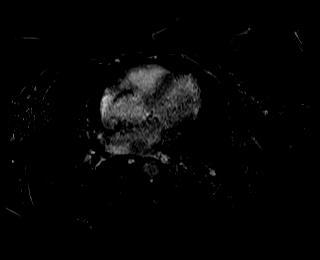

[Series 29: T1 dynamic · axial · 3.0mm · 1.25mm/px · z∈[-93,+144]mm · 3 of 80 slices shown (6 of 8)]
[im 1/80]
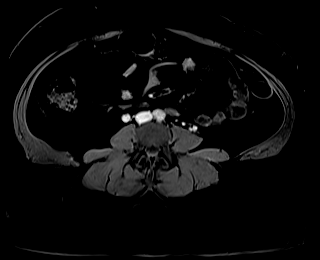
[im 40/80]
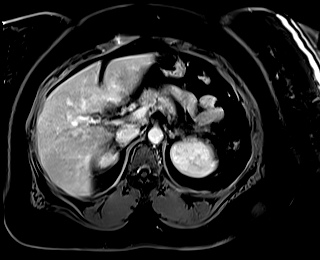
[im 80/80]
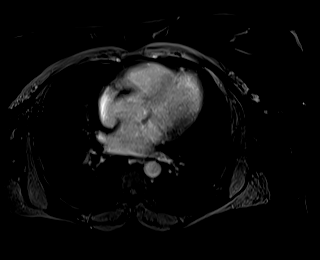

[Series 30: T1 dynamic · axial · 3.0mm · 1.25mm/px · z∈[-93,+144]mm · 3 of 80 slices shown (7 of 8)]
[im 1/80]
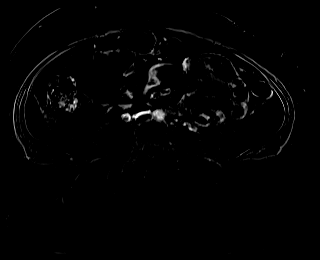
[im 40/80]
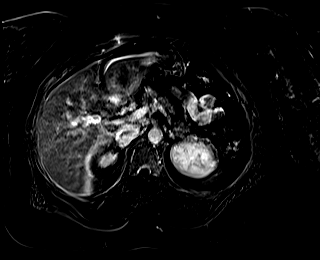
[im 80/80]
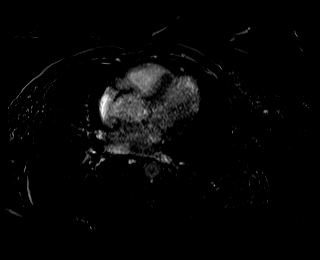

[Series 32: T1 dynamic · coronal · 3.0mm · 1.41mm/px · 2 of 72 slices shown (8 of 8)]
[im 1/72]
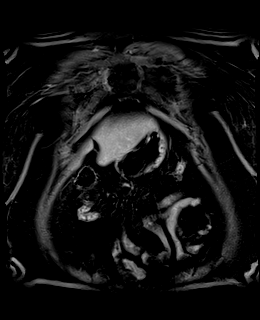
[im 36/72]
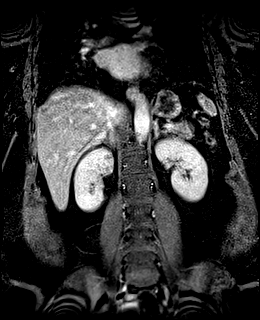

[38 of 48 positions shown; findings below may reference images not displayed]

FINDINGS: Lower chest: Subsegmental atelectasis noted within the left lower
lobe. No pleural effusion.

Hepatobiliary: No focal suspicious enhancing liver abnormality.
Status post cholecystectomy. The common bile duct measures 7 mm in
maximum diameter. No signs of choledocholithiasis.

Pancreas: No mass, inflammatory changes, or other parenchymal
abnormality identified.

Spleen:  Within normal limits in size and appearance.

Adrenals/Urinary Tract: Normal adrenal glands. Simple appearing cyst
within upper pole of right kidney measures 1.1 x 0.7 cm and is
compatible with a benign Bosniak class 1 cyst. No follow-up
recommended. No suspicious mass or hydronephrosis identified
bilaterally.

Stomach/Bowel: Stomach appears normal. No dilated loops of large or
small bowel.

Vascular/Lymphatic: Aortic atherosclerosis without aneurysm. No
abdominopelvic adenopathy.

Other: There is no free fluid or fluid collections identified within
the abdomen or pelvis. Fluid signal intensity within the ventral
abdominal wall overlying the liver likely represents postoperative
change from laparoscopic port.

Musculoskeletal: No suspicious bone lesions identified.
IMPRESSION: 1. No acute findings within the abdomen.
2. Status post cholecystectomy. No evidence of choledocholithiasis.

## 2021-06-25 MED ORDER — KCL IN DEXTROSE-NACL 20-5-0.9 MEQ/L-%-% IV SOLN
INTRAVENOUS | Status: DC
  Filled 2021-06-25 (×9): qty 1000

## 2021-06-25 MED ORDER — OXYCODONE HCL 5 MG PO TABS
5.0000 mg | ORAL_TABLET | Freq: Four times a day (QID) | ORAL | Status: DC | PRN
  Administered 2021-06-26 – 2021-06-27 (×3): 5 mg via ORAL
  Filled 2021-06-25 (×5): qty 1

## 2021-06-25 MED ORDER — LORAZEPAM 2 MG/ML IJ SOLN
0.5000 mg | Freq: Once | INTRAMUSCULAR | Status: AC
  Administered 2021-06-25: 0.5 mg via INTRAVENOUS
  Filled 2021-06-25: qty 1

## 2021-06-25 MED ORDER — FENTANYL CITRATE PF 50 MCG/ML IJ SOSY
100.0000 ug | PREFILLED_SYRINGE | Freq: Once | INTRAMUSCULAR | Status: AC | PRN
  Administered 2021-06-25: 100 ug via INTRAVENOUS
  Filled 2021-06-25: qty 2

## 2021-06-25 MED ORDER — ONDANSETRON HCL 4 MG/2ML IJ SOLN
4.0000 mg | Freq: Once | INTRAMUSCULAR | Status: AC
  Administered 2021-06-25: 4 mg via INTRAVENOUS
  Filled 2021-06-25: qty 2

## 2021-06-25 MED ORDER — ONDANSETRON HCL 4 MG/2ML IJ SOLN
4.0000 mg | Freq: Four times a day (QID) | INTRAMUSCULAR | Status: DC | PRN
  Administered 2021-06-26 – 2021-06-27 (×2): 4 mg via INTRAVENOUS
  Filled 2021-06-25 (×2): qty 2

## 2021-06-25 MED ORDER — ONDANSETRON 4 MG PO TBDP: 4.0000 mg | ORAL_TABLET | Freq: Four times a day (QID) | ORAL | Status: DC | PRN

## 2021-06-25 MED ORDER — MORPHINE SULFATE (PF) 2 MG/ML IV SOLN
1.0000 mg | INTRAVENOUS | Status: DC | PRN
  Administered 2021-06-25 – 2021-06-26 (×3): 2 mg via INTRAVENOUS
  Administered 2021-06-26: 4 mg via INTRAVENOUS
  Administered 2021-06-26: 2 mg via INTRAVENOUS
  Administered 2021-06-26: 1 mg via INTRAVENOUS
  Administered 2021-06-26: 4 mg via INTRAVENOUS
  Administered 2021-06-27: 2 mg via INTRAVENOUS
  Filled 2021-06-25 (×5): qty 1
  Filled 2021-06-25: qty 2
  Filled 2021-06-25 (×2): qty 1
  Filled 2021-06-25: qty 2

## 2021-06-25 MED ORDER — PANTOPRAZOLE SODIUM 40 MG IV SOLR
40.0000 mg | Freq: Every day | INTRAVENOUS | Status: DC
  Administered 2021-06-25 – 2021-06-28 (×4): 40 mg via INTRAVENOUS
  Filled 2021-06-25 (×4): qty 10

## 2021-06-25 MED ORDER — FENTANYL CITRATE PF 50 MCG/ML IJ SOSY
100.0000 ug | PREFILLED_SYRINGE | Freq: Once | INTRAMUSCULAR | Status: AC
  Administered 2021-06-25: 100 ug via INTRAVENOUS
  Filled 2021-06-25: qty 2

## 2021-06-25 MED ORDER — GADOBUTROL 1 MMOL/ML IV SOLN
10.0000 mL | Freq: Once | INTRAVENOUS | Status: AC | PRN
  Administered 2021-06-25: 10 mL via INTRAVENOUS

## 2021-06-25 MED ORDER — IOHEXOL 300 MG/ML  SOLN
100.0000 mL | Freq: Once | INTRAMUSCULAR | Status: AC | PRN
  Administered 2021-06-25: 100 mL via INTRAVENOUS

## 2021-06-25 MED ORDER — METOPROLOL TARTRATE 5 MG/5ML IV SOLN
2.5000 mg | Freq: Three times a day (TID) | INTRAVENOUS | Status: DC | PRN
  Administered 2021-06-26 – 2021-06-28 (×2): 2.5 mg via INTRAVENOUS
  Filled 2021-06-25 (×2): qty 5

## 2021-06-25 MED ORDER — HYDROMORPHONE HCL 1 MG/ML IJ SOLN
1.0000 mg | Freq: Once | INTRAMUSCULAR | Status: AC
  Administered 2021-06-25: 1 mg via INTRAVENOUS
  Filled 2021-06-25: qty 1

## 2021-06-25 NOTE — ED Notes (Signed)
Pt ambulatory with slow gait to restroom. Towel and washcloth provided for patient comfort ?

## 2021-06-25 NOTE — ED Notes (Signed)
Patient transported to CT 

## 2021-06-25 NOTE — Progress Notes (Signed)
Patient has been pre-medicated for anxiety/claustrophobia prior to MRI. Called MRI to let them know patient is ready to be transported to MRI.  ?

## 2021-06-25 NOTE — Progress Notes (Signed)
Patient getting transported to MRI now.  ?

## 2021-06-25 NOTE — ED Notes (Signed)
Call to [facility] ED spoke to [place], charge.  Pt travelling by POV per Dr Elmon Kirschner, IV to remain in place, wrapped for protection for transport.  Pt reports improvement in pain following dilaudid ?

## 2021-06-25 NOTE — ED Triage Notes (Addendum)
Pt reports cholecystectomy 3/20. C/o increase in pain in the last 24 hours. Taking tylenol and muscle relaxers for pain without relief. Pt tearful on arrival. Also reports 2 episodes of vomiting this morning. Last tylenol ~0030. ?

## 2021-06-25 NOTE — Plan of Care (Signed)
  Problem: Education: Goal: Knowledge of General Education information will improve Description Including pain rating scale, medication(s)/side effects and non-pharmacologic comfort measures Outcome: Progressing   

## 2021-06-25 NOTE — H&P (Signed)
Angela Ayers is an 41 y.o. female.   ?Chief Complaint: abd pain ?HPI: The patient is a 41 year old black female who is about 2 weeks status post laparoscopic cholecystectomy by Dr. Marcello Moores.  She initially did well.  Over the last couple days her pain has returned.  She describes it as being very similar to the pain she had before surgery.  She has had some nausea and vomiting associated with it.  She came to the emergency department where a CT scan showed normal postoperative changes and no other significant findings.  Her liver functions were found to be elevated though. ? ?Past Medical History:  ?Diagnosis Date  ? Asthma   ? ? ?Past Surgical History:  ?Procedure Laterality Date  ? APPENDECTOMY    ? CESAREAN SECTION    ? CHOLECYSTECTOMY N/A 06/12/2021  ? Procedure: LAPAROSCOPIC CHOLECYSTECTOMY;  Surgeon: Leighton Ruff, MD;  Location: [facility];  Service: General;  Laterality: N/A;  ? ? ?No family history on file. ?Social History:  reports that she has quit smoking. Her smoking use included cigarettes. She smoked an average of .5 packs per day. She has never used smokeless tobacco. She reports that she does not drink alcohol and does not use drugs. ? ?Allergies:  ?Allergies  ?Allergen Reactions  ? Latex   ?  hives  ? ? ?(Not in a hospital admission) ? ? ?Results for orders placed or performed during the hospital encounter of 06/25/21 (from the past 48 hour(s))  ?CBC with Differential/Platelet     Status: Abnormal  ? Collection Time: 06/25/21  3:19 AM  ?Result Value Ref Range  ? WBC 4.9 4.0 - 10.5 K/uL  ? RBC 4.60 3.87 - 5.11 MIL/uL  ? Hemoglobin 11.5 (L) 12.0 - 15.0 g/dL  ? HCT 35.0 (L) 36.0 - 46.0 %  ? MCV 76.1 (L) 80.0 - 100.0 fL  ? MCH 25.0 (L) 26.0 - 34.0 pg  ? MCHC 32.9 30.0 - 36.0 g/dL  ? RDW 16.7 (H) 11.5 - 15.5 %  ? Platelets 427 (H) 150 - 400 K/uL  ? nRBC 0.0 0.0 - 0.2 %  ? Neutrophils Relative % 57 %  ? Neutro Abs 2.8 1.7 - 7.7 K/uL  ? Lymphocytes Relative 28 %  ? Lymphs Abs 1.4 0.7 - 4.0 K/uL  ? Monocytes  Relative 9 %  ? Monocytes Absolute 0.5 0.1 - 1.0 K/uL  ? Eosinophils Relative 5 %  ? Eosinophils Absolute 0.2 0.0 - 0.5 K/uL  ? Basophils Relative 1 %  ? Basophils Absolute 0.0 0.0 - 0.1 K/uL  ? Immature Granulocytes 0 %  ? Abs Immature Granulocytes 0.01 0.00 - 0.07 K/uL  ?  Comment: Performed at [facility], [address] [place], [place] [address]  ?Comprehensive metabolic panel     Status: Abnormal  ? Collection Time: 06/25/21  3:19 AM  ?Result Value Ref Range  ? Sodium 136 135 - 145 mmol/L  ? Potassium 3.6 3.5 - 5.1 mmol/L  ? Chloride 105 98 - 111 mmol/L  ? CO2 22 22 - 32 mmol/L  ? Glucose, Bld 113 (H) 70 - 99 mg/dL  ?  Comment: Glucose reference range applies only to samples taken after fasting for at least 8 hours.  ? BUN 9 6 - 20 mg/dL  ? Creatinine, Ser 0.76 0.44 - 1.00 mg/dL  ? Calcium 8.7 (L) 8.9 - 10.3 mg/dL  ? Total Protein 7.6 6.5 - 8.1 g/dL  ? Albumin 3.9 3.5 - 5.0 g/dL  ?  AST 563 (H) 15 - 41 U/L  ? ALT 219 (H) 0 - 44 U/L  ? Alkaline Phosphatase 118 38 - 126 U/L  ? Total Bilirubin 1.1 0.3 - 1.2 mg/dL  ? GFR, Estimated >60 >60 mL/min  ?  Comment: (NOTE) ?Calculated using the CKD-EPI Creatinine Equation (2021) ?  ? Anion gap 9 5 - 15  ?  Comment: Performed at [facility], [address] [place], [place] [address]  ?Lipase, blood     Status: None  ? Collection Time: 06/25/21  3:19 AM  ?Result Value Ref Range  ? Lipase 22 11 - 51 U/L  ?  Comment: Performed at [facility], [address], [place], [place] [address]  ?Hepatic function panel     Status: Abnormal  ? Collection Time: 06/25/21 10:19 AM  ?Result Value Ref Range  ? Total Protein 7.2 6.5 - 8.1 g/dL  ? Albumin 3.5 3.5 - 5.0 g/dL  ? AST 663 (H) 15 - 41 U/L  ? ALT 331 (H) 0 - 44 U/L  ? Alkaline Phosphatase 133 (H) 38 - 126 U/L  ? Total Bilirubin 1.6 (H) 0.3 - 1.2 mg/dL  ? Bilirubin, Direct 0.9 (H) 0.0 - 0.2 mg/dL  ? Indirect Bilirubin 0.7 0.3 - 0.9 mg/dL  ?  Comment: Performed at [facility], [address], [place], [place] [address]  ? ?CT ABDOMEN PELVIS W CONTRAST ? ?Result Date: 06/25/2021 ?CLINICAL DATA:  Cholecystectomy on 06/12/2021. Worsening abdominal pain. EXAM: CT ABDOMEN AND PELVIS WITH CONTRAST TECHNIQUE: Multidetector CT imaging of the abdomen and pelvis was performed using the standard protocol following bolus administration of intravenous contrast. RADIATION DOSE REDUCTION: This exam was performed according to the departmental dose-optimization program which includes automated exposure control, adjustment of the mA and/or kV according to patient size and/or use of iterative reconstruction technique. CONTRAST:  170m OMNIPAQUE IOHEXOL 300 MG/ML  SOLN COMPARISON:  06/12/2021. FINDINGS: Lower chest: Subsegmental atelectasis noted left lower lobe. Hepatobiliary: No suspicious focal abnormality within the liver parenchyma. Gallbladder surgically absent. Trace fluid noted in the gallbladder fossa. Possible edema in the hepatoduodenal ligament. No biliary dilatation. Pancreas: No focal mass lesion. No dilatation of the main duct. No intraparenchymal cyst. No peripancreatic edema. Spleen: No splenomegaly. No focal mass lesion. Adrenals/Urinary Tract: No adrenal nodule or mass. Kidneys unremarkable. No evidence for hydroureter. The urinary bladder appears normal for the degree of distention. Stomach/Bowel: Stomach is moderately distended with fluid and contrast. Duodenum is normally positioned as is the ligament of Treitz. No small bowel wall thickening. No small bowel dilatation. The terminal ileum is normal. The appendix is not well visualized, but there is no edema or inflammation in the region of the cecum. Colon is diffusely decompressed. Vascular/Lymphatic: No abdominal aortic aneurysm. No abdominal lymphadenopathy. No pelvic sidewall lymphadenopathy. Reproductive: The uterus is unremarkable.  There is no adnexal mass. Other: No intraperitoneal free fluid. Musculoskeletal: No worrisome lytic or  sclerotic osseous abnormality. IMPRESSION: 1. Status post cholecystectomy with trace fluid in the gallbladder fossa, consistent with recent surgery. Possible trace edema in the hepatoduodenal ligament. No biliary dilatation. No evidence for abscess. 2. Otherwise unremarkable exam. Electronically Signed   By: EMisty StanleyM.D.   On: 06/25/2021 08:16   ? ?Review of Systems  ?Constitutional: Negative.   ?HENT: Negative.    ?Eyes: Negative.   ?Respiratory: Negative.    ?Cardiovascular: Negative.   ?Gastrointestinal:  Positive for abdominal pain, nausea and vomiting.  ?  Endocrine: Negative.   ?Genitourinary: Negative.   ?Musculoskeletal: Negative.   ?Skin: Negative.   ?Allergic/Immunologic: Negative.   ?Neurological: Negative.   ?Hematological: Negative.   ?Psychiatric/Behavioral: Negative.    ? ?Blood pressure (!) 151/102, pulse 81, temperature 98.3 ?F (36.8 ?C), temperature source Oral, resp. rate 20, height '5\' 5"'$  (1.651 m), weight 104.3 kg, last menstrual period 06/23/2021, SpO2 100 %. ?Physical Exam ?Vitals reviewed.  ?Constitutional:   ?   General: She is not in acute distress. ?   Appearance: Normal appearance. She is obese.  ?HENT:  ?   Head: Normocephalic and atraumatic.  ?   Right Ear: External ear normal.  ?   Left Ear: External ear normal.  ?   Nose: Nose normal.  ?   Mouth/Throat:  ?   Mouth: Mucous membranes are moist.  ?   Pharynx: Oropharynx is clear.  ?Eyes:  ?   General: No scleral icterus. ?   Extraocular Movements: Extraocular movements intact.  ?   Conjunctiva/sclera: Conjunctivae normal.  ?   Pupils: Pupils are equal, round, and reactive to light.  ?Cardiovascular:  ?   Rate and Rhythm: Normal rate and regular rhythm.  ?   Pulses: Normal pulses.  ?   Heart sounds: Normal heart sounds.  ?Pulmonary:  ?   Effort: Pulmonary effort is normal.  ?   Breath sounds: Normal breath sounds.  ?Abdominal:  ?   General: Abdomen is flat.  ?   Palpations: Abdomen is soft.  ?   Tenderness: There is abdominal  tenderness.  ?   Comments: Incisions look good  ?Musculoskeletal:     ?   General: No swelling or deformity. Normal range of motion.  ?   Cervical back: Normal range of motion and neck supple. No tenderness.  ?Skin: ?

## 2021-06-25 NOTE — ED Provider Notes (Signed)
41 yo female w/ hx of cholecystectomy on 06/12/21, presenting with abdominal pain. ?Transminitis ?Pending CT abdomen pelvis ? ?0830 am -I personally reviewed and interpreted CT scan, agree with the radiologist report, the stomach is distended but there is no evident perioperative or postoperative abscess.  No clear cause of the patient's pain.  Unclear significance of edema in the hepatoduodenal ligament.  I will discuss the case with the on-call surgeon, including the patient's transaminitis, as not clear whether this would be expected postoperatively after 2 weeks. ? ?My reassessment the patient is pain-free at this time. ? ?I discussed her distended stomach, and I clarified with her whether she has ever had cyclical vomiting or issues with gastroparesis, which she denies. ? ?I spoke to Dr Marlou Starks for surgery oncall, specifically regarding the patient's transaminitis, which she states could still be expected in the setting of the procedure 13 days ago.  If the patient has any persistent or recurrent pain, she could be transferred ED to ED for surgery evaluation; otherwise she can follow-up in the office and have her liver enzymes rechecked. ? ?* ? ?10 am-patient's pain has returned and is significant again, doubled over and nauseated.  I have ordered some IV Dilaudid.  At this point in time I discussed with the patient and her daughter transferred to the [facility] long emergency department for a surgical consult, and possible medical admission for pain control.  It is not clear what is causing her pain.  We will recheck her liver enzymes and redraw these prior to her transfer.  I do feel she is reasonably safe and stable for transfer by private vehicle, her daughter can take her to the [facility] ER directly.  I spoke to Dr Jeanell Sparrow EDP at [place] regarding patient's arrival and plan for consult to Dr Marlou Starks, from general surgery.  I sent a secure message to Dr Marlou Starks to update him about the transfer as well. ?  ?Wyvonnia Dusky,  MD ?06/25/21 1047 ? ?

## 2021-06-25 NOTE — ED Provider Notes (Signed)
Patient transferred from [facility] for general surgery consultation regarding transaminitis, abdominal pain after cholecystectomy on 06/12/21.  I have seen the patient at bedside.  She is alert and oriented.  Hemodynamically stable.  She understands that she is here to see surgery.  She has no complaints at this time.  Currently not having pain. ?Paged Dr. Marlou Starks @ 1115 for patient to be admitted. ?  ?Mickie Hillier, PA-C ?06/25/21 1136 ? ?  ?Pattricia Boss, MD ?06/25/21 1319 ? ?

## 2021-06-25 NOTE — ED Notes (Signed)
Cardiac monitor showing several episodes of pvc's.  Physician made aware.   ?

## 2021-06-25 NOTE — Progress Notes (Signed)
Paged Autumn Messing, MD about patient's increased BP. Received verbal order for IV Metoprolol 2.5 mg q 8 hr PRN for systolic number > 080 and diastolic number > 223.  ?

## 2021-06-25 NOTE — ED Provider Notes (Signed)
? ?[facility] ?Provider Note: Georgena Spurling, MD, FACEP ? ?CSN: [id number] ?MRN: [id number] ?ARRIVAL: 06/25/21 at [place] ?ROOM: [facility] ? ? ?CHIEF COMPLAINT  ?Abdominal Pain ? ? ?HISTORY OF PRESENT ILLNESS  ?06/25/21 3:24 AM ?Angela Ayers is a 41 y.o. female who underwent a laparoscopic cholecystectomy by Ned Card, MD on 06/12/2021.  She had been doing well until yesterday when she developed right upper quadrant pain.  The pain is now severe (7 out of 10), is constant and sharp in nature.  It is worse with movement or palpation.  She vomited twice this morning which she attributes to the pain.  She has been taking Tylenol and a muscle relaxant for the pain without relief.  Her last dose of Tylenol was about 12:30 AM. ? ? ?Past Medical History:  ?Diagnosis Date  ? Asthma   ? ? ?Past Surgical History:  ?Procedure Laterality Date  ? APPENDECTOMY    ? CESAREAN SECTION    ? CHOLECYSTECTOMY N/A 06/12/2021  ? Procedure: LAPAROSCOPIC CHOLECYSTECTOMY;  Surgeon: Leighton Ruff, MD;  Location: [facility];  Service: General;  Laterality: N/A;  ? ? ?No family history on file. ? ?Social History  ? ?Tobacco Use  ? Smoking status: Former  ?  Packs/day: 0.50  ?  Types: Cigarettes  ? Smokeless tobacco: Never  ?Vaping Use  ? Vaping Use: Never used  ?Substance Use Topics  ? Alcohol use: No  ? Drug use: No  ? ? ?Prior to Admission medications   ?Medication Sig Start Date End Date Taking? Authorizing Provider  ?acetaminophen (TYLENOL) 500 MG tablet Take 2 tablets (1,000 mg total) by mouth every 8 (eight) hours as needed. 06/13/21  Yes Maczis, Barth Kirks, PA-C  ?methocarbamol (ROBAXIN) 500 MG tablet Take 1 tablet (500 mg total) by mouth every 6 (six) hours as needed for muscle spasms. 06/13/21  Yes Maczis, Barth Kirks, PA-C  ?metroNIDAZOLE (FLAGYL) 500 MG tablet Take 1 tablet (500 mg total) by mouth 2 (two) times daily with a meal. DO NOT CONSUME ALCOHOL WHILE TAKING THIS MEDICATION. 06/13/21   Maczis, Barth Kirks, PA-C  ?oxyCODONE (OXY  IR/ROXICODONE) 5 MG immediate release tablet Take 1 tablet (5 mg total) by mouth every 6 (six) hours as needed for breakthrough pain. ?Patient not taking: Reported on 06/25/2021 06/13/21   Jillyn Ledger, PA-C  ? ? ?Allergies ?Latex ? ? ?REVIEW OF SYSTEMS  ?Negative except as noted here or in the History of Present Illness. ? ? ?PHYSICAL EXAMINATION  ?Initial Vital Signs ?Blood pressure (!) 177/100, pulse 86, temperature 99.4 ?F (37.4 ?C), temperature source Oral, resp. rate (!) 21, height '5\' 5"'$  (1.651 m), weight 104.3 kg, last menstrual period 06/23/2021, SpO2 98 %. ? ?Examination ?General: Well-developed, well-nourished female in no acute distress; appearance consistent with age of record ?HENT: normocephalic; atraumatic ?Eyes: Normal appearance ?Neck: supple ?Heart: regular rate and rhythm ?Lungs: clear to auscultation bilaterally ?Abdomen: soft; nondistended; epigastric and right upper quadrant tenderness; bowel sounds present ?Extremities: No deformity; full range of motion; pulses normal ?Neurologic: Awake, alert and oriented; motor function intact in all extremities and symmetric; no facial droop ?Skin: Warm and dry ?Psychiatric: Tearful ? ? ?RESULTS  ?Summary of this visit's results, reviewed and interpreted by myself: ? ? EKG Interpretation ? ?Date/Time:    ?Ventricular Rate:    ?PR Interval:    ?QRS Duration:   ?QT Interval:    ?QTC Calculation:   ?R Axis:     ?Text Interpretation:   ?  ? ?  ? ?  Laboratory Studies: ?Results for orders placed or performed during the hospital encounter of 06/25/21 (from the past 24 hour(s))  ?CBC with Differential/Platelet     Status: Abnormal  ? Collection Time: 06/25/21  3:19 AM  ?Result Value Ref Range  ? WBC 4.9 4.0 - 10.5 K/uL  ? RBC 4.60 3.87 - 5.11 MIL/uL  ? Hemoglobin 11.5 (L) 12.0 - 15.0 g/dL  ? HCT 35.0 (L) 36.0 - 46.0 %  ? MCV 76.1 (L) 80.0 - 100.0 fL  ? MCH 25.0 (L) 26.0 - 34.0 pg  ? MCHC 32.9 30.0 - 36.0 g/dL  ? RDW 16.7 (H) 11.5 - 15.5 %  ? Platelets 427 (H) 150  - 400 K/uL  ? nRBC 0.0 0.0 - 0.2 %  ? Neutrophils Relative % 57 %  ? Neutro Abs 2.8 1.7 - 7.7 K/uL  ? Lymphocytes Relative 28 %  ? Lymphs Abs 1.4 0.7 - 4.0 K/uL  ? Monocytes Relative 9 %  ? Monocytes Absolute 0.5 0.1 - 1.0 K/uL  ? Eosinophils Relative 5 %  ? Eosinophils Absolute 0.2 0.0 - 0.5 K/uL  ? Basophils Relative 1 %  ? Basophils Absolute 0.0 0.0 - 0.1 K/uL  ? Immature Granulocytes 0 %  ? Abs Immature Granulocytes 0.01 0.00 - 0.07 K/uL  ?Comprehensive metabolic panel     Status: Abnormal  ? Collection Time: 06/25/21  3:19 AM  ?Result Value Ref Range  ? Sodium 136 135 - 145 mmol/L  ? Potassium 3.6 3.5 - 5.1 mmol/L  ? Chloride 105 98 - 111 mmol/L  ? CO2 22 22 - 32 mmol/L  ? Glucose, Bld 113 (H) 70 - 99 mg/dL  ? BUN 9 6 - 20 mg/dL  ? Creatinine, Ser 0.76 0.44 - 1.00 mg/dL  ? Calcium 8.7 (L) 8.9 - 10.3 mg/dL  ? Total Protein 7.6 6.5 - 8.1 g/dL  ? Albumin 3.9 3.5 - 5.0 g/dL  ? AST 563 (H) 15 - 41 U/L  ? ALT 219 (H) 0 - 44 U/L  ? Alkaline Phosphatase 118 38 - 126 U/L  ? Total Bilirubin 1.1 0.3 - 1.2 mg/dL  ? GFR, Estimated >60 >60 mL/min  ? Anion gap 9 5 - 15  ?Lipase, blood     Status: None  ? Collection Time: 06/25/21  3:19 AM  ?Result Value Ref Range  ? Lipase 22 11 - 51 U/L  ?Hepatic function panel     Status: Abnormal  ? Collection Time: 06/25/21 10:19 AM  ?Result Value Ref Range  ? Total Protein 7.2 6.5 - 8.1 g/dL  ? Albumin 3.5 3.5 - 5.0 g/dL  ? AST 663 (H) 15 - 41 U/L  ? ALT 331 (H) 0 - 44 U/L  ? Alkaline Phosphatase 133 (H) 38 - 126 U/L  ? Total Bilirubin 1.6 (H) 0.3 - 1.2 mg/dL  ? Bilirubin, Direct 0.9 (H) 0.0 - 0.2 mg/dL  ? Indirect Bilirubin 0.7 0.3 - 0.9 mg/dL  ? ?Imaging Studies: ?CT ABDOMEN PELVIS W CONTRAST ? ?Result Date: 06/25/2021 ?CLINICAL DATA:  Cholecystectomy on 06/12/2021. Worsening abdominal pain. EXAM: CT ABDOMEN AND PELVIS WITH CONTRAST TECHNIQUE: Multidetector CT imaging of the abdomen and pelvis was performed using the standard protocol following bolus administration of intravenous  contrast. RADIATION DOSE REDUCTION: This exam was performed according to the departmental dose-optimization program which includes automated exposure control, adjustment of the mA and/or kV according to patient size and/or use of iterative reconstruction technique. CONTRAST:  162m OMNIPAQUE IOHEXOL 300 MG/ML  SOLN  COMPARISON:  06/12/2021. FINDINGS: Lower chest: Subsegmental atelectasis noted left lower lobe. Hepatobiliary: No suspicious focal abnormality within the liver parenchyma. Gallbladder surgically absent. Trace fluid noted in the gallbladder fossa. Possible edema in the hepatoduodenal ligament. No biliary dilatation. Pancreas: No focal mass lesion. No dilatation of the main duct. No intraparenchymal cyst. No peripancreatic edema. Spleen: No splenomegaly. No focal mass lesion. Adrenals/Urinary Tract: No adrenal nodule or mass. Kidneys unremarkable. No evidence for hydroureter. The urinary bladder appears normal for the degree of distention. Stomach/Bowel: Stomach is moderately distended with fluid and contrast. Duodenum is normally positioned as is the ligament of Treitz. No small bowel wall thickening. No small bowel dilatation. The terminal ileum is normal. The appendix is not well visualized, but there is no edema or inflammation in the region of the cecum. Colon is diffusely decompressed. Vascular/Lymphatic: No abdominal aortic aneurysm. No abdominal lymphadenopathy. No pelvic sidewall lymphadenopathy. Reproductive: The uterus is unremarkable.  There is no adnexal mass. Other: No intraperitoneal free fluid. Musculoskeletal: No worrisome lytic or sclerotic osseous abnormality. IMPRESSION: 1. Status post cholecystectomy with trace fluid in the gallbladder fossa, consistent with recent surgery. Possible trace edema in the hepatoduodenal ligament. No biliary dilatation. No evidence for abscess. 2. Otherwise unremarkable exam. Electronically Signed   By: Misty Stanley M.D.   On: 06/25/2021 08:16  ? ?MR 3D  Recon At Scanner ? ?Result Date: 06/25/2021 ?CLINICAL DATA:  Evaluate for choledocholithiasis. EXAM: MRI ABDOMEN WITHOUT AND WITH CONTRAST (INCLUDING MRCP) TECHNIQUE: Multiplanar multisequence MR imaging of the abdo

## 2021-06-26 ENCOUNTER — Inpatient Hospital Stay (HOSPITAL_COMMUNITY): Payer: Medicare Other

## 2021-06-26 DIAGNOSIS — R7989 Other specified abnormal findings of blood chemistry: Secondary | ICD-10-CM | POA: Diagnosis present

## 2021-06-26 DIAGNOSIS — R1011 Right upper quadrant pain: Secondary | ICD-10-CM | POA: Diagnosis present

## 2021-06-26 DIAGNOSIS — Z9049 Acquired absence of other specified parts of digestive tract: Secondary | ICD-10-CM | POA: Diagnosis not present

## 2021-06-26 DIAGNOSIS — E669 Obesity, unspecified: Secondary | ICD-10-CM | POA: Diagnosis present

## 2021-06-26 DIAGNOSIS — Z87891 Personal history of nicotine dependence: Secondary | ICD-10-CM | POA: Diagnosis not present

## 2021-06-26 DIAGNOSIS — Z6838 Body mass index (BMI) 38.0-38.9, adult: Secondary | ICD-10-CM | POA: Diagnosis not present

## 2021-06-26 DIAGNOSIS — J45909 Unspecified asthma, uncomplicated: Secondary | ICD-10-CM | POA: Diagnosis present

## 2021-06-26 LAB — CBC
HCT: 34.6 % — ABNORMAL LOW (ref 36.0–46.0)
Hemoglobin: 11 g/dL — ABNORMAL LOW (ref 12.0–15.0)
MCH: 25 pg — ABNORMAL LOW (ref 26.0–34.0)
MCHC: 31.8 g/dL (ref 30.0–36.0)
MCV: 78.6 fL — ABNORMAL LOW (ref 80.0–100.0)
Platelets: 379 10*3/uL (ref 150–400)
RBC: 4.4 MIL/uL (ref 3.87–5.11)
RDW: 17 % — ABNORMAL HIGH (ref 11.5–15.5)
WBC: 4.9 10*3/uL (ref 4.0–10.5)
nRBC: 0 % (ref 0.0–0.2)

## 2021-06-26 LAB — COMPREHENSIVE METABOLIC PANEL
ALT: 340 U/L — ABNORMAL HIGH (ref 0–44)
AST: 335 U/L — ABNORMAL HIGH (ref 15–41)
Albumin: 3.8 g/dL (ref 3.5–5.0)
Alkaline Phosphatase: 156 U/L — ABNORMAL HIGH (ref 38–126)
Anion gap: 6 (ref 5–15)
BUN: 6 mg/dL (ref 6–20)
CO2: 26 mmol/L (ref 22–32)
Calcium: 9 mg/dL (ref 8.9–10.3)
Chloride: 105 mmol/L (ref 98–111)
Creatinine, Ser: 0.7 mg/dL (ref 0.44–1.00)
GFR, Estimated: >60 mL/min (ref >60)
Glucose, Bld: 131 mg/dL — ABNORMAL HIGH (ref 70–99)
Potassium: 3.5 mmol/L (ref 3.5–5.1)
Sodium: 137 mmol/L (ref 135–145)
Total Bilirubin: 2.9 mg/dL — ABNORMAL HIGH (ref 0.3–1.2)
Total Protein: 7.6 g/dL (ref 6.5–8.1)

## 2021-06-26 LAB — HEPATITIS PANEL, ACUTE
HCV Ab: NONREACTIVE
Hep A IgM: NONREACTIVE
Hep B C IgM: NONREACTIVE
Hepatitis B Surface Ag: NONREACTIVE

## 2021-06-26 IMAGING — NM NM HEPATOBILIARY SCAN
2 series · 12 of 12 positions shown · non-contrast
Comparison: Abdominal ultrasound [DATE]. Abdominal pelvic CT
and abdominal MRI [DATE].

CLINICAL DATA: Abdominal pain with elevated bilirubin level status
post cholecystectomy [DATE]. Concern for bile leak.

EXAM:
NUCLEAR MEDICINE HEPATOBILIARY IMAGING
TECHNIQUE: Sequential images of the abdomen were obtained [DATE] minutes
following intravenous administration of radiopharmaceutical.
RADIOPHARMACEUTICALS:  7.59 mCi [3W]  Choletec IV

[Series 1: hida scan hr · 3.28mm/px · 6 of 60 frames shown]
[frame 6/60]
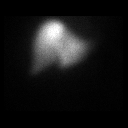
[frame 16/60]
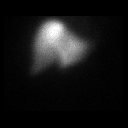
[frame 26/60]
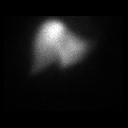
[frame 36/60]
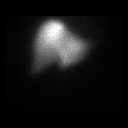
[frame 46/60]
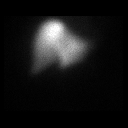
[frame 56/60]
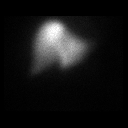

[Series 1: hida scan · 3.28mm/px · 6 of 60 frames shown]
[frame 6/60]
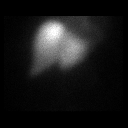
[frame 16/60]
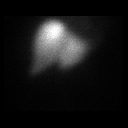
[frame 26/60]
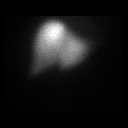
[frame 36/60]
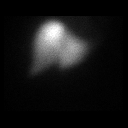
[frame 46/60]
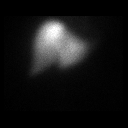
[frame 56/60]
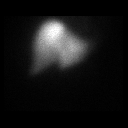

[12 of 12 positions shown; findings below may reference images not displayed]

FINDINGS: There is prompt hepatic uptake of the radiopharmaceutical. Through 2
hours of imaging, no definite biliary excretion of the
radiopharmaceutical is identified. No bowel or extraluminal activity
identified to suggest a bile leak. No significant biliary dilatation
or choledocholithiasis on recent MRCP to suggest biliary
obstruction. No suspicious fluid collections on imaging yesterday to
suggest bile leak.
IMPRESSION: 1. No biliary excretion of activity through 2 hours of imaging, most
consistent with hepatitis or hepatic dysfunction.
2. In this setting, there is limited assessment for a bile leak.
However, in correlation with recent CT and MR, no findings
concerning for bile leak or biliary obstruction. Recommend clinical
follow-up.

## 2021-06-26 IMAGING — US US HEPATIC LIVER DOPPLER
1 series · 15 of 25 positions shown · non-contrast
Comparison: None.

CLINICAL DATA: Postoperative abdominal pain

EXAM:
DUPLEX ULTRASOUND OF LIVER
TECHNIQUE: Color and duplex Doppler ultrasound was performed to evaluate the
hepatic in-flow and out-flow vessels.

[Series 1: us art/ven flow abd pelv doppl mc & wl · 15 of 73 slices shown]
[im 1/73]
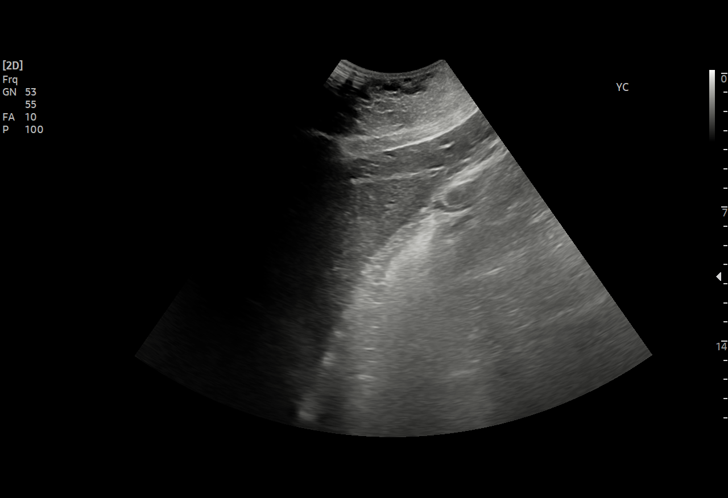
[im 7/73]
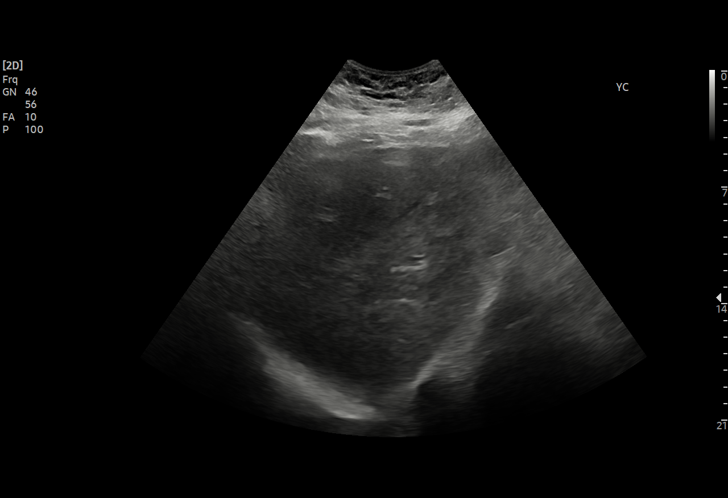
[im 13/73]
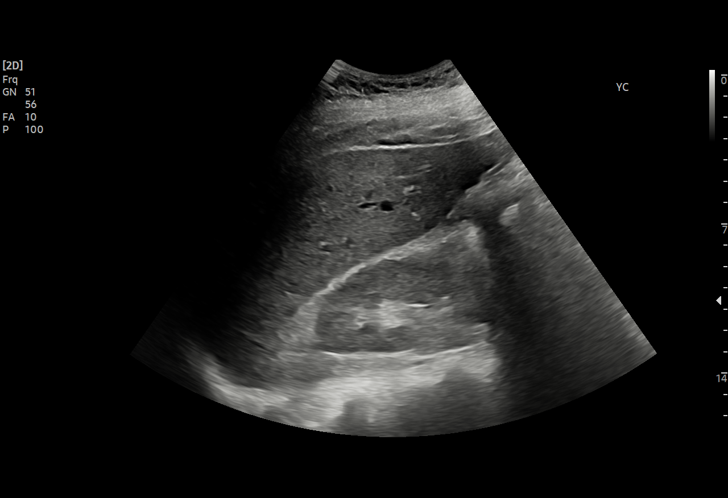
[im 16/73]
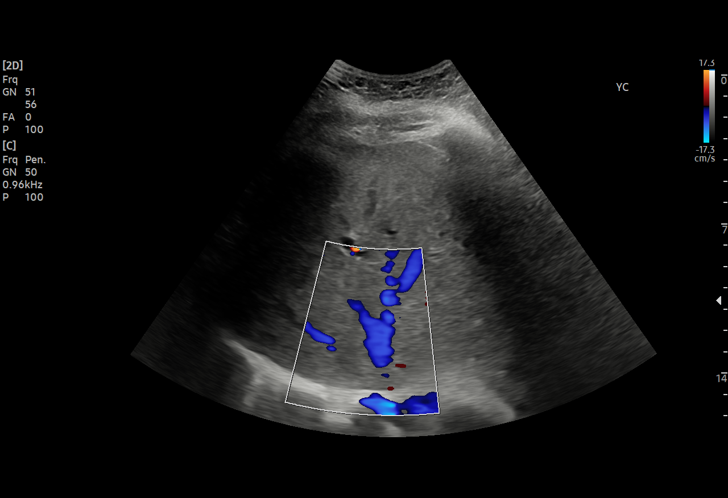
[im 22/73]
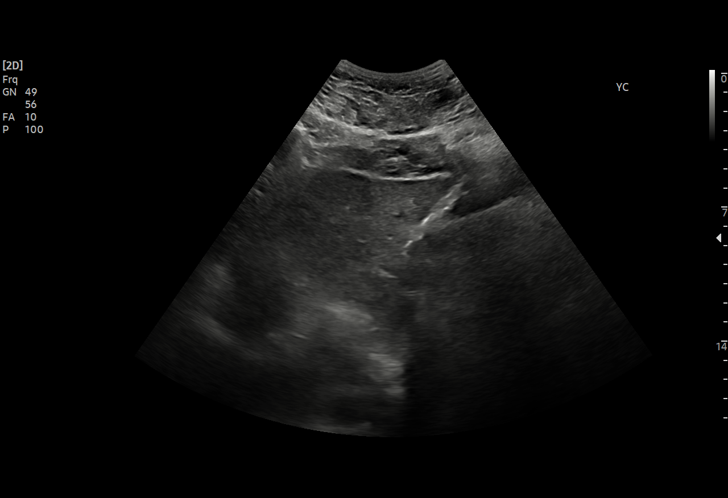
[im 28/73]
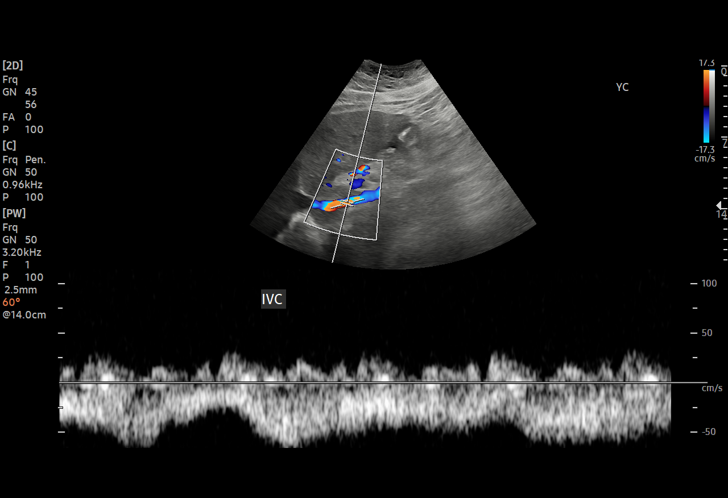
[im 31/73]
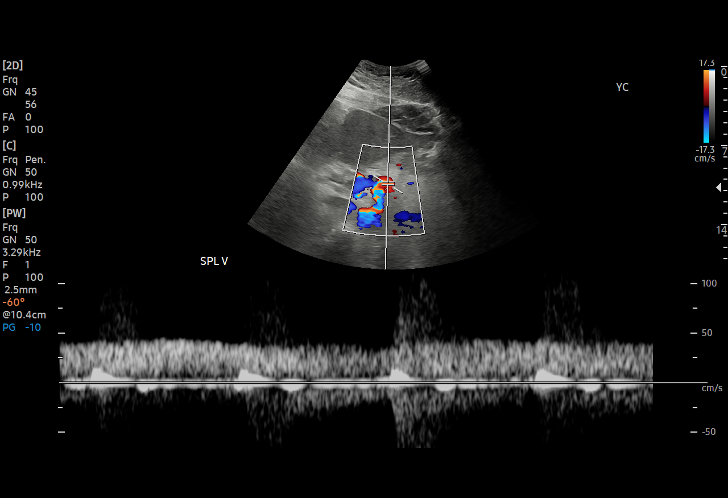
[im 37/73]
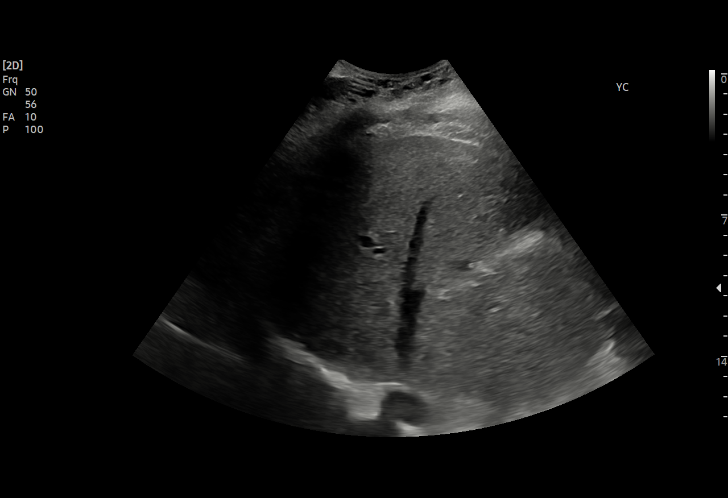
[im 43/73]
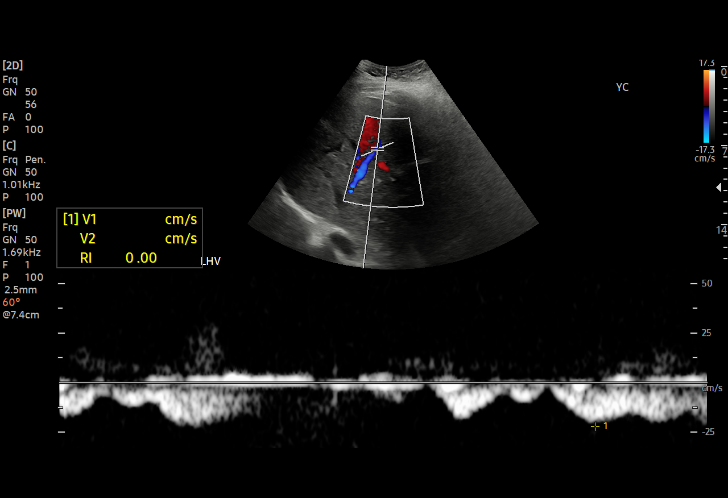
[im 46/73]
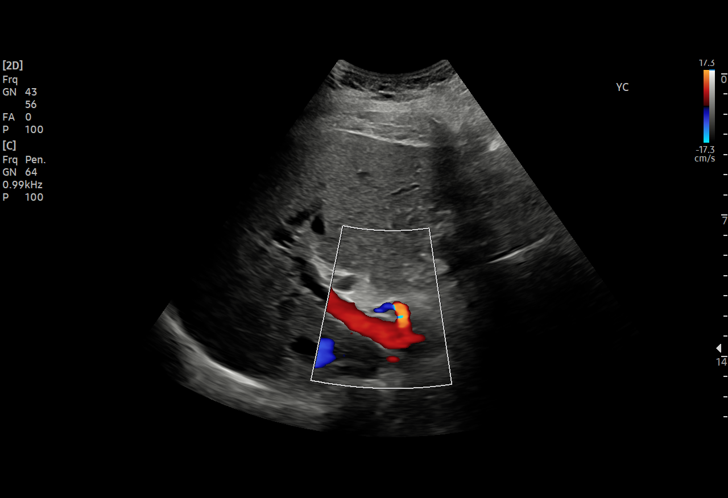
[im 52/73]
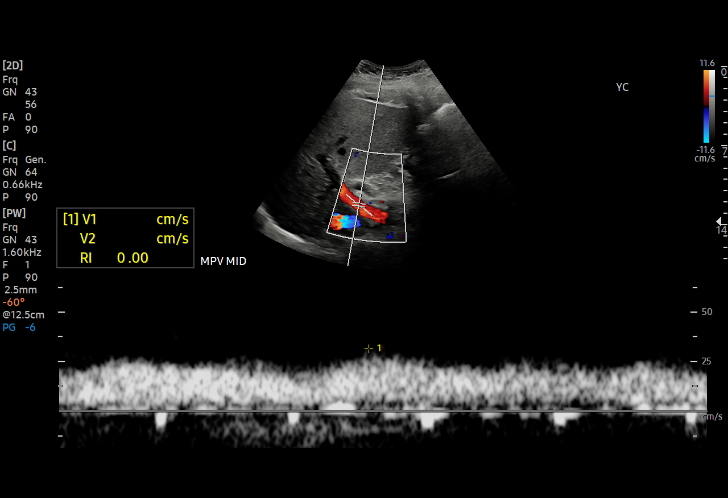
[im 58/73]
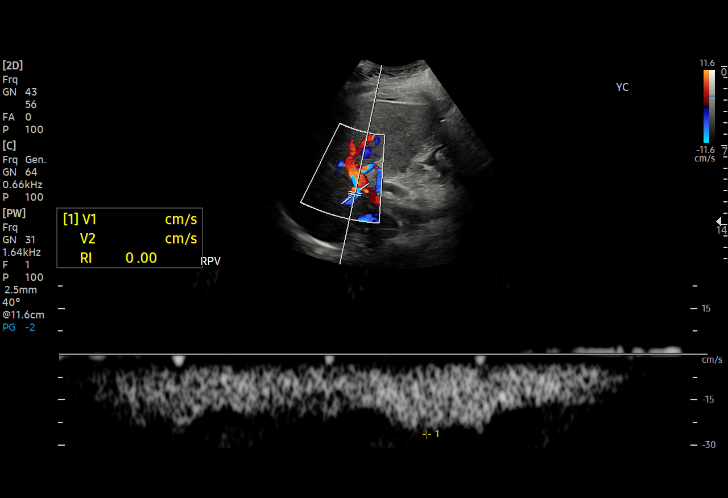
[im 61/73]
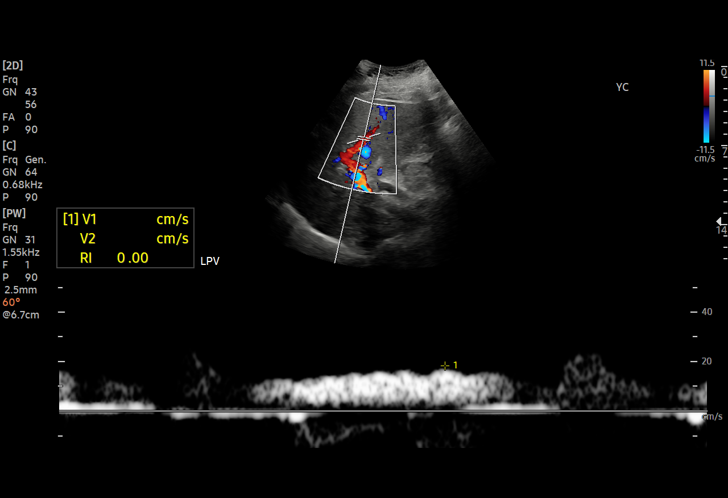
[im 67/73]
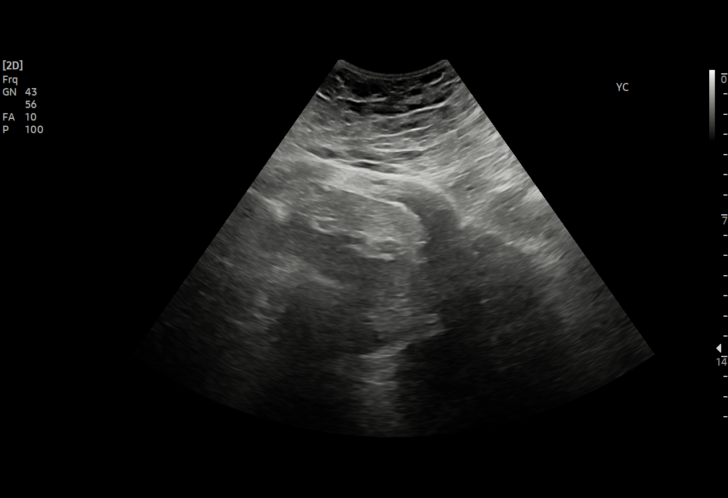
[im 73/73]
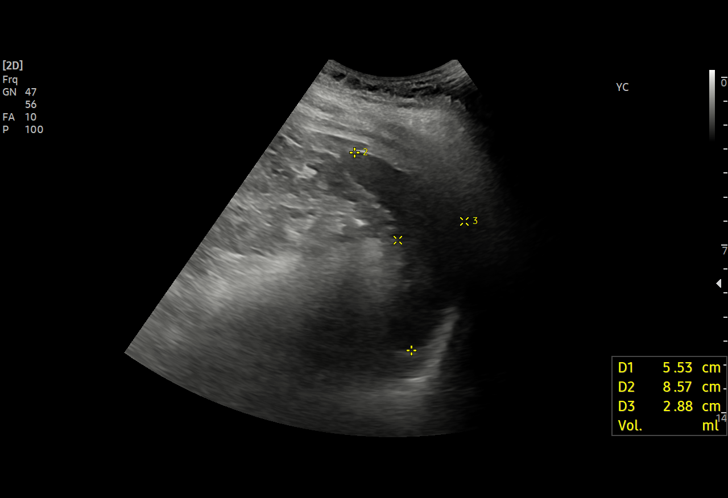

[15 of 25 positions shown; findings below may reference images not displayed]

FINDINGS: Liver: Normal parenchymal echogenicity. Normal hepatic contour
without nodularity.

No focal lesion, mass or intrahepatic biliary ductal dilatation.

Main Portal Vein size: 1.2 cm

Portal Vein Velocities

Main Prox:  42 cm/sec with normal hepatopetal directional flow.

Main Mid: 32 cm/sec with normal hepatopetal directional flow.

Main Dist:  37 cm/sec with normal hepatopetal directional flow.
Right: 27 cm/sec with normal hepatopetal directional flow.
Left: 18 cm/sec with normal hepatopetal directional flow.

Hepatic Vein Velocities

Right:  30 cm/sec

Middle:  33 cm/sec

Left:  23 cm/sec

IVC: Present and patent with normal respiratory phasicity.

Hepatic Artery Velocity:  102 cm/sec

Splenic Vein Velocity:  48 cm/sec

Spleen: 5.5 cm x 2.9 cm x 8.6 cm with a total volume of 71.4 cm^3
(411 cm^3 is upper limit normal)

Portal Vein Occlusion/Thrombus: No

Splenic Vein Occlusion/Thrombus: No

Ascites: None

Varices: None
IMPRESSION: 1. Unremarkable hepatic Doppler ultrasound.
2. Hepatic and portal veins are widely patent with normal
directional flow.
3. No focal liver abnormality.

## 2021-06-26 MED ORDER — TECHNETIUM TC 99M MEBROFENIN IV KIT
7.5000 | PACK | Freq: Once | INTRAVENOUS | Status: AC | PRN
  Administered 2021-06-26: 7.5 via INTRAVENOUS

## 2021-06-26 MED ORDER — ENOXAPARIN SODIUM 40 MG/0.4ML IJ SOSY
40.0000 mg | PREFILLED_SYRINGE | INTRAMUSCULAR | Status: DC
  Administered 2021-06-26 – 2021-06-29 (×4): 40 mg via SUBCUTANEOUS
  Filled 2021-06-26 (×4): qty 0.4

## 2021-06-26 NOTE — Progress Notes (Addendum)
? ?Progress Note ? ?   ?Subjective: ?Still having persistent RUQ abdominal pain but overall improved during admission. She has been NPO. She is nauseas but has not vomited. No respiratory or urinary complaints ? ?Objective: ?Vital signs in last 24 hours: ?Temp:  [97.5 ?F (36.4 ?C)-98.8 ?F (37.1 ?C)] 98.8 ?F (37.1 ?C) (04/03 0531) ?Pulse Rate:  [59-92] 84 (04/03 0531) ?Resp:  [14-20] 16 (04/03 0531) ?BP: (133-198)/(87-125) 133/87 (04/03 0531) ?SpO2:  [97 %-100 %] 97 % (04/03 0531) ?Last BM Date : 06/24/21 ? ?Intake/Output from previous day: ?04/02 0701 - 04/03 0700 ?In: 846.8 [I.V.:846.8] ?Out: 0  ?Intake/Output this shift: ?No intake/output data recorded. ? ?PE: ?General: pleasant, WD, female who is laying in bed in NAD ?HEENT: head is normocephalic, atraumatic.  Mouth is pink and moist ?Heart: Palpable radial pulses bilaterally ?Lungs: Respiratory effort nonlabored on room air ?Abd: soft, ND. Focal TTP in RUQ and epigastrium without rebound or guarding. Incisions healing well without discharge or erythema ?MSK: all 4 extremities are symmetrical with no cyanosis, clubbing, or edema. Calves soft and NT bilaterally ?Skin: warm and dry ?Psych: A&Ox3 with an appropriate affect.  ? ? ?Lab Results:  ?Recent Labs  ?  06/25/21 ?0319 06/26/21 ?0309  ?WBC 4.9 4.9  ?HGB 11.5* 11.0*  ?HCT 35.0* 34.6*  ?PLT 427* 379  ? ?BMET ?Recent Labs  ?  06/25/21 ?0319 06/26/21 ?0309  ?NA 136 137  ?K 3.6 3.5  ?CL 105 105  ?CO2 22 26  ?GLUCOSE 113* 131*  ?BUN 9 6  ?CREATININE 0.76 0.70  ?CALCIUM 8.7* 9.0  ? ?PT/INR ?No results for input(s): LABPROT, INR in the last 72 hours. ?CMP  ?   ?Component Value Date/Time  ? NA 137 06/26/2021 0309  ? K 3.5 06/26/2021 0309  ? CL 105 06/26/2021 0309  ? CO2 26 06/26/2021 0309  ? GLUCOSE 131 (H) 06/26/2021 0309  ? BUN 6 06/26/2021 0309  ? CREATININE 0.70 06/26/2021 0309  ? CALCIUM 9.0 06/26/2021 0309  ? PROT 7.6 06/26/2021 0309  ? ALBUMIN 3.8 06/26/2021 0309  ? AST 335 (H) 06/26/2021 0309  ? ALT 340 (H)  06/26/2021 0309  ? ALKPHOS 156 (H) 06/26/2021 0309  ? BILITOT 2.9 (H) 06/26/2021 0309  ? GFRNONAA >60 06/26/2021 0309  ? GFRAA >60 12/14/2015 0805  ? ?Lipase  ?   ?Component Value Date/Time  ? LIPASE 22 06/25/2021 0319  ? ? ? ? ? ?Studies/Results: ?CT ABDOMEN PELVIS W CONTRAST ? ?Result Date: 06/25/2021 ?CLINICAL DATA:  Cholecystectomy on 06/12/2021. Worsening abdominal pain. EXAM: CT ABDOMEN AND PELVIS WITH CONTRAST TECHNIQUE: Multidetector CT imaging of the abdomen and pelvis was performed using the standard protocol following bolus administration of intravenous contrast. RADIATION DOSE REDUCTION: This exam was performed according to the departmental dose-optimization program which includes automated exposure control, adjustment of the mA and/or kV according to patient size and/or use of iterative reconstruction technique. CONTRAST:  176m OMNIPAQUE IOHEXOL 300 MG/ML  SOLN COMPARISON:  06/12/2021. FINDINGS: Lower chest: Subsegmental atelectasis noted left lower lobe. Hepatobiliary: No suspicious focal abnormality within the liver parenchyma. Gallbladder surgically absent. Trace fluid noted in the gallbladder fossa. Possible edema in the hepatoduodenal ligament. No biliary dilatation. Pancreas: No focal mass lesion. No dilatation of the main duct. No intraparenchymal cyst. No peripancreatic edema. Spleen: No splenomegaly. No focal mass lesion. Adrenals/Urinary Tract: No adrenal nodule or mass. Kidneys unremarkable. No evidence for hydroureter. The urinary bladder appears normal for the degree of distention. Stomach/Bowel: Stomach is moderately distended with  fluid and contrast. Duodenum is normally positioned as is the ligament of Treitz. No small bowel wall thickening. No small bowel dilatation. The terminal ileum is normal. The appendix is not well visualized, but there is no edema or inflammation in the region of the cecum. Colon is diffusely decompressed. Vascular/Lymphatic: No abdominal aortic aneurysm. No  abdominal lymphadenopathy. No pelvic sidewall lymphadenopathy. Reproductive: The uterus is unremarkable.  There is no adnexal mass. Other: No intraperitoneal free fluid. Musculoskeletal: No worrisome lytic or sclerotic osseous abnormality. IMPRESSION: 1. Status post cholecystectomy with trace fluid in the gallbladder fossa, consistent with recent surgery. Possible trace edema in the hepatoduodenal ligament. No biliary dilatation. No evidence for abscess. 2. Otherwise unremarkable exam. Electronically Signed   By: Misty Stanley M.D.   On: 06/25/2021 08:16  ? ?MR 3D Recon At Scanner ? ?Result Date: 06/25/2021 ?CLINICAL DATA:  Evaluate for choledocholithiasis. EXAM: MRI ABDOMEN WITHOUT AND WITH CONTRAST (INCLUDING MRCP) TECHNIQUE: Multiplanar multisequence MR imaging of the abdomen was performed both before and after the administration of intravenous contrast. Heavily T2-weighted images of the biliary and pancreatic ducts were obtained, and three-dimensional MRCP images were rendered by post processing. CONTRAST:  76m GADAVIST GADOBUTROL 1 MMOL/ML IV SOLN COMPARISON:  CT AP 06/25/2021 FINDINGS: Lower chest: Subsegmental atelectasis noted within the left lower lobe. No pleural effusion. Hepatobiliary: No focal suspicious enhancing liver abnormality. Status post cholecystectomy. The common bile duct measures 7 mm in maximum diameter. No signs of choledocholithiasis. Pancreas: No mass, inflammatory changes, or other parenchymal abnormality identified. Spleen:  Within normal limits in size and appearance. Adrenals/Urinary Tract: Normal adrenal glands. Simple appearing cyst within upper pole of right kidney measures 1.1 x 0.7 cm and is compatible with a benign Bosniak class 1 cyst. No follow-up recommended. No suspicious mass or hydronephrosis identified bilaterally. Stomach/Bowel: Stomach appears normal. No dilated loops of large or small bowel. Vascular/Lymphatic: Aortic atherosclerosis without aneurysm. No abdominopelvic  adenopathy. Other: There is no free fluid or fluid collections identified within the abdomen or pelvis. Fluid signal intensity within the ventral abdominal wall overlying the liver likely represents postoperative change from laparoscopic port. Musculoskeletal: No suspicious bone lesions identified. IMPRESSION: 1. No acute findings within the abdomen. 2. Status post cholecystectomy. No evidence of choledocholithiasis. Electronically Signed   By: TKerby MoorsM.D.   On: 06/25/2021 17:03  ? ?MR ABDOMEN MRCP W WO CONTAST ? ?Result Date: 06/25/2021 ?CLINICAL DATA:  Evaluate for choledocholithiasis. EXAM: MRI ABDOMEN WITHOUT AND WITH CONTRAST (INCLUDING MRCP) TECHNIQUE: Multiplanar multisequence MR imaging of the abdomen was performed both before and after the administration of intravenous contrast. Heavily T2-weighted images of the biliary and pancreatic ducts were obtained, and three-dimensional MRCP images were rendered by post processing. CONTRAST:  160mGADAVIST GADOBUTROL 1 MMOL/ML IV SOLN COMPARISON:  CT AP 06/25/2021 FINDINGS: Lower chest: Subsegmental atelectasis noted within the left lower lobe. No pleural effusion. Hepatobiliary: No focal suspicious enhancing liver abnormality. Status post cholecystectomy. The common bile duct measures 7 mm in maximum diameter. No signs of choledocholithiasis. Pancreas: No mass, inflammatory changes, or other parenchymal abnormality identified. Spleen:  Within normal limits in size and appearance. Adrenals/Urinary Tract: Normal adrenal glands. Simple appearing cyst within upper pole of right kidney measures 1.1 x 0.7 cm and is compatible with a benign Bosniak class 1 cyst. No follow-up recommended. No suspicious mass or hydronephrosis identified bilaterally. Stomach/Bowel: Stomach appears normal. No dilated loops of large or small bowel. Vascular/Lymphatic: Aortic atherosclerosis without aneurysm. No abdominopelvic adenopathy. Other: There is no  free fluid or fluid collections  identified within the abdomen or pelvis. Fluid signal intensity within the ventral abdominal wall overlying the liver likely represents postoperative change from laparoscopic port. Musculoskeletal: No suspic

## 2021-06-26 NOTE — Progress Notes (Signed)
?  Transition of Care (TOC) Screening Note ? ? ?Patient Details  ?Name: Angela Ayers ?Date of Birth: 07-Oct-1980 ? ? ?Transition of Care (TOC) CM/SW Contact:    ?Deshante Cassell, LCSW ?Phone Number: ?06/26/2021, 3:35 PM ? ? ? ?Transition of Care Department [facility]) has reviewed patient and no TOC needs have been identified at this time. We will continue to monitor patient advancement through interdisciplinary progression rounds. If new patient transition needs arise, please place a TOC consult. ? ? ?

## 2021-06-27 LAB — CBC
HCT: 32.3 % — ABNORMAL LOW (ref 36.0–46.0)
Hemoglobin: 10.2 g/dL — ABNORMAL LOW (ref 12.0–15.0)
MCH: 24.8 pg — ABNORMAL LOW (ref 26.0–34.0)
MCHC: 31.6 g/dL (ref 30.0–36.0)
MCV: 78.4 fL — ABNORMAL LOW (ref 80.0–100.0)
Platelets: 370 10*3/uL (ref 150–400)
RBC: 4.12 MIL/uL (ref 3.87–5.11)
RDW: 17.1 % — ABNORMAL HIGH (ref 11.5–15.5)
WBC: 5.5 10*3/uL (ref 4.0–10.5)
nRBC: 0 % (ref 0.0–0.2)

## 2021-06-27 LAB — FERRITIN: Ferritin: 13 ng/mL (ref 11–307)

## 2021-06-27 LAB — COMPREHENSIVE METABOLIC PANEL
ALT: 234 U/L — ABNORMAL HIGH (ref 0–44)
AST: 141 U/L — ABNORMAL HIGH (ref 15–41)
Albumin: 3.5 g/dL (ref 3.5–5.0)
Alkaline Phosphatase: 139 U/L — ABNORMAL HIGH (ref 38–126)
Anion gap: 8 (ref 5–15)
BUN: <5 mg/dL — ABNORMAL LOW (ref 6–20)
CO2: 23 mmol/L (ref 22–32)
Calcium: 8.8 mg/dL — ABNORMAL LOW (ref 8.9–10.3)
Chloride: 106 mmol/L (ref 98–111)
Creatinine, Ser: 0.56 mg/dL (ref 0.44–1.00)
GFR, Estimated: >60 mL/min (ref >60)
Glucose, Bld: 129 mg/dL — ABNORMAL HIGH (ref 70–99)
Potassium: 3.6 mmol/L (ref 3.5–5.1)
Sodium: 137 mmol/L (ref 135–145)
Total Bilirubin: 3 mg/dL — ABNORMAL HIGH (ref 0.3–1.2)
Total Protein: 7.1 g/dL (ref 6.5–8.1)

## 2021-06-27 LAB — PROTIME-INR
INR: 1.1 (ref 0.8–1.2)
Prothrombin Time: 13.7 seconds (ref 11.4–15.2)

## 2021-06-27 LAB — IRON AND TIBC
Iron: 29 ug/dL (ref 28–170)
Saturation Ratios: 7 % — ABNORMAL LOW (ref 10.4–31.8)
TIBC: 435 ug/dL (ref 250–450)
UIBC: 406 ug/dL

## 2021-06-27 MED ORDER — SENNA 8.6 MG PO TABS: 1.0000 | ORAL_TABLET | Freq: Every evening | ORAL | Status: DC | PRN

## 2021-06-27 MED ORDER — DOCUSATE SODIUM 100 MG PO CAPS
100.0000 mg | ORAL_CAPSULE | Freq: Two times a day (BID) | ORAL | Status: DC
  Administered 2021-06-27 – 2021-06-28 (×2): 100 mg via ORAL
  Filled 2021-06-27 (×5): qty 1

## 2021-06-27 MED ORDER — POLYETHYLENE GLYCOL 3350 17 G PO PACK: 17.0000 g | PACK | Freq: Every day | ORAL | Status: DC | PRN

## 2021-06-27 MED ORDER — IBUPROFEN 400 MG PO TABS
600.0000 mg | ORAL_TABLET | Freq: Four times a day (QID) | ORAL | Status: DC | PRN
  Administered 2021-06-27: 600 mg via ORAL
  Filled 2021-06-27: qty 1

## 2021-06-27 NOTE — Consult Note (Signed)
Referring Provider: [facility] ?Primary Care Physician:  [facility] ?Primary Gastroenterologist: Althia Forts ? ?Reason for Consultation:  abdominal pain ? ?HPI: Angela Ayers is a 41 y.o. female presents for evaluation of postop abdominal pain ? ?S/p 2 weeks ago laparoscopic cholecystectomy by Dr. Marcello Moores.  Over the last couple days her pain has returned, similar to prior to her surgery.  Associated nausea and vomiting as well.  CT scan showed normal postop changes with no other significant findings.  LFTs are elevated. ? ?Patient states after surgery she was initially doing well but then the pain recurred.  States right now she has been on pain medication and no longer feels the pain.  But when she did feel the pain she was unsure if it was constant or intermittent.  Was RUQ pain that felt exactly like it did when she had her gallbladder.  No change in bowel movements (has not had a bowel movement in a few days due to decreased oral intake).  Denies melena/hematochezia prior to admission.  Denies weight loss.  States every time she has tried to eat or drink anything she has vomited it back up.  Today's the first day she has been able to tolerate liquids without vomiting.  Denies family history of colon cancer or GI malignancies.  Father has a history of polymyositis.  Denies NSAID use.  Social alcohol use.  Has not smoked cigarettes in 90 days.  Denies previous history of EGD/colonoscopy. ? ? ?Past Medical History:  ?Diagnosis Date  ? Asthma   ? ? ?Past Surgical History:  ?Procedure Laterality Date  ? APPENDECTOMY    ? CESAREAN SECTION    ? CHOLECYSTECTOMY N/A 06/12/2021  ? Procedure: LAPAROSCOPIC CHOLECYSTECTOMY;  Surgeon: Leighton Ruff, MD;  Location: [facility];  Service: General;  Laterality: N/A;  ? ? ?Prior to Admission medications   ?Medication Sig Start Date End Date Taking? Authorizing Provider  ?acetaminophen (TYLENOL) 500 MG tablet Take 2 tablets (1,000 mg total) by mouth every 8 (eight)  hours as needed. 06/13/21  Yes Maczis, Barth Kirks, PA-C  ?methocarbamol (ROBAXIN) 500 MG tablet Take 1 tablet (500 mg total) by mouth every 6 (six) hours as needed for muscle spasms. 06/13/21  Yes Maczis, Barth Kirks, PA-C  ?metroNIDAZOLE (FLAGYL) 500 MG tablet Take 1 tablet (500 mg total) by mouth 2 (two) times daily with a meal. DO NOT CONSUME ALCOHOL WHILE TAKING THIS MEDICATION. 06/13/21   Maczis, Barth Kirks, PA-C  ?oxyCODONE (OXY IR/ROXICODONE) 5 MG immediate release tablet Take 1 tablet (5 mg total) by mouth every 6 (six) hours as needed for breakthrough pain. ?Patient not taking: Reported on 06/25/2021 06/13/21   Jillyn Ledger, PA-C  ? ? ?Scheduled Meds: ? docusate sodium  100 mg Oral BID  ? enoxaparin (LOVENOX) injection  40 mg Subcutaneous Q24H  ? pantoprazole (PROTONIX) IV  40 mg Intravenous QHS  ? ?Continuous Infusions: ? dextrose 5 % and 0.9 % NaCl with KCl 20 mEq/L 100 mL/hr at 06/25/21 1759  ? ?PRN Meds:.ibuprofen, metoprolol tartrate, morphine injection, ondansetron **OR** ondansetron (ZOFRAN) IV, oxyCODONE, polyethylene glycol, senna ? ?Allergies as of 06/25/2021 - Review Complete 06/25/2021  ?Allergen Reaction Noted  ? Latex  08/29/2015  ? ? ?No family history on file. ? ?Social History  ? ?Socioeconomic History  ? Marital status: Single  ?  Spouse name: Not on file  ? Number of children: Not on file  ? Years of education: Not on file  ? Highest education level:  Not on file  ?Occupational History  ? Not on file  ?Tobacco Use  ? Smoking status: Former  ?  Packs/day: 0.50  ?  Types: Cigarettes  ? Smokeless tobacco: Never  ?Vaping Use  ? Vaping Use: Never used  ?Substance and Sexual Activity  ? Alcohol use: No  ? Drug use: No  ? Sexual activity: Not on file  ?Other Topics Concern  ? Not on file  ?Social History Narrative  ? Not on file  ? ?Social Determinants of Health  ? ?Financial Resource Strain: Not on file  ?Food Insecurity: Not on file  ?Transportation Needs: Not on file  ?Physical Activity: Not on  file  ?Stress: Not on file  ?Social Connections: Not on file  ?Intimate Partner Violence: Not on file  ? ? ?Review of Systems: Review of Systems  ?Constitutional:  Negative for chills and fever.  ?HENT:  Negative for hearing loss and tinnitus.   ?Eyes:  Negative for blurred vision and double vision.  ?Respiratory:  Negative for cough and hemoptysis.   ?Cardiovascular:  Negative for chest pain and palpitations.  ?Gastrointestinal:  Positive for abdominal pain, nausea and vomiting. Negative for blood in stool, constipation, diarrhea, heartburn and melena.  ?Genitourinary:  Negative for dysuria and urgency.  ?Musculoskeletal:  Negative for myalgias and neck pain.  ?Skin:  Negative for itching and rash.  ?Neurological:  Negative for seizures and loss of consciousness.  ?Psychiatric/Behavioral:  Negative for substance abuse. The patient is not nervous/anxious.    ? ?Physical Exam:Physical Exam ?Constitutional:   ?   Appearance: Normal appearance. She is obese.  ?   Comments: 3 daughters at bedside  ?HENT:  ?   Head: Normocephalic and atraumatic.  ?   Nose: Nose normal. No congestion.  ?   Mouth/Throat:  ?   Mouth: Mucous membranes are moist.  ?   Pharynx: Oropharynx is clear.  ?Eyes:  ?   Extraocular Movements: Extraocular movements intact.  ?   Conjunctiva/sclera: Conjunctivae normal.  ?Cardiovascular:  ?   Rate and Rhythm: Normal rate and regular rhythm.  ?Pulmonary:  ?   Effort: Pulmonary effort is normal. No respiratory distress.  ?Abdominal:  ?   General: Bowel sounds are normal. There is no distension.  ?   Palpations: Abdomen is soft. There is no mass.  ?   Tenderness: There is no abdominal tenderness. There is no guarding or rebound.  ?   Hernia: No hernia is present.  ?Musculoskeletal:     ?   General: No swelling. Normal range of motion.  ?   Cervical back: Normal range of motion and neck supple.  ?Skin: ?   General: Skin is warm and dry.  ?Neurological:  ?   General: No focal deficit present.  ?   Mental  Status: She is oriented to person, place, and time.  ?Psychiatric:     ?   Mood and Affect: Mood normal.     ?   Behavior: Behavior normal.     ?   Thought Content: Thought content normal.     ?   Judgment: Judgment normal.  ?  ?Vital signs: ?Vitals:  ? 06/27/21 0141 06/27/21 0514  ?BP: (!) 113/58 132/65  ?Pulse: 88 83  ?Resp: 16 16  ?Temp: 98.8 ?F (37.1 ?C) 98.7 ?F (37.1 ?C)  ?SpO2: 96% 97%  ? ?Last BM Date : 06/24/21 ? ? ? ?GI:  ?Lab Results: ?Recent Labs  ?  06/25/21 ?0319 06/26/21 ?0309 06/27/21 ?7017  ?  WBC 4.9 4.9 5.5  ?HGB 11.5* 11.0* 10.2*  ?HCT 35.0* 34.6* 32.3*  ?PLT 427* 379 370  ? ?BMET ?Recent Labs  ?  06/25/21 ?0319 06/26/21 ?0309 06/27/21 ?0741  ?NA 136 137 137  ?K 3.6 3.5 3.6  ?CL 105 105 106  ?CO2 '22 26 23  '$ ?GLUCOSE 113* 131* 129*  ?BUN 9 6 <5*  ?CREATININE 0.76 0.70 0.56  ?CALCIUM 8.7* 9.0 8.8*  ? ?LFT ?Recent Labs  ?  06/25/21 ?1019 06/26/21 ?0309 06/27/21 ?0741  ?PROT 7.2   < > 7.1  ?ALBUMIN 3.5   < > 3.5  ?AST 663*   < > 141*  ?ALT 331*   < > 234*  ?ALKPHOS 133*   < > 139*  ?BILITOT 1.6*   < > 3.0*  ?BILIDIR 0.9*  --   --   ?IBILI 0.7  --   --   ? < > = values in this interval not displayed.  ? ?PT/INR ?No results for input(s): LABPROT, INR in the last 72 hours. ? ? ?Studies/Results: ?MR 3D Recon At Scanner ? ?Result Date: 06/25/2021 ?CLINICAL DATA:  Evaluate for choledocholithiasis. EXAM: MRI ABDOMEN WITHOUT AND WITH CONTRAST (INCLUDING MRCP) TECHNIQUE: Multiplanar multisequence MR imaging of the abdomen was performed both before and after the administration of intravenous contrast. Heavily T2-weighted images of the biliary and pancreatic ducts were obtained, and three-dimensional MRCP images were rendered by post processing. CONTRAST:  49m GADAVIST GADOBUTROL 1 MMOL/ML IV SOLN COMPARISON:  CT AP 06/25/2021 FINDINGS: Lower chest: Subsegmental atelectasis noted within the left lower lobe. No pleural effusion. Hepatobiliary: No focal suspicious enhancing liver abnormality. Status post  cholecystectomy. The common bile duct measures 7 mm in maximum diameter. No signs of choledocholithiasis. Pancreas: No mass, inflammatory changes, or other parenchymal abnormality identified. Spleen:  Within normal limits i

## 2021-06-27 NOTE — Progress Notes (Signed)
? ?Progress Note ? ?   ?Subjective: ?Abdominal pain improved today. Chief complaint is a headache which she also had yesterday. It is exacerbated by light. She does not have chronic headaches at baseline. No nausea or emesis today but did have episode of emesis after drinking ginger ale yesterday. Last BM since prior to admission ? ?Objective: ?Vital signs in last 24 hours: ?Temp:  [98.1 ?F (36.7 ?C)-98.8 ?F (37.1 ?C)] 98.7 ?F (37.1 ?C) (04/04 9767) ?Pulse Rate:  [73-88] 83 (04/04 0514) ?Resp:  [15-16] 16 (04/04 0514) ?BP: (113-173)/(58-92) 132/65 (04/04 0514) ?SpO2:  [94 %-100 %] 97 % (04/04 0514) ?Last BM Date : 06/24/21 ? ?Intake/Output from previous day: ?04/03 0701 - 04/04 0700 ?In: 992.2 [P.O.:100; I.V.:892.2] ?Out: 600 [Urine:400; Emesis/NG output:200] ?Intake/Output this shift: ?No intake/output data recorded. ? ?PE: ?General: pleasant, WD, female who is laying in bed in NAD ?HEENT: head is normocephalic, atraumatic.  Mouth is pink and moist ?Heart: Palpable radial pulses bilaterally ?Lungs: Respiratory effort nonlabored on room air ?Abd: soft, ND. No TTP today. Incisions healing well without discharge or erythema ?MSK: all 4 extremities are symmetrical with no cyanosis, clubbing, or edema. Calves soft and NT bilaterally ?Skin: warm and dry ?Psych: A&Ox3 with an appropriate affect.  ? ? ?Lab Results:  ?Recent Labs  ?  06/25/21 ?0319 06/26/21 ?0309  ?WBC 4.9 4.9  ?HGB 11.5* 11.0*  ?HCT 35.0* 34.6*  ?PLT 427* 379  ? ? ?BMET ?Recent Labs  ?  06/25/21 ?0319 06/26/21 ?0309  ?NA 136 137  ?K 3.6 3.5  ?CL 105 105  ?CO2 22 26  ?GLUCOSE 113* 131*  ?BUN 9 6  ?CREATININE 0.76 0.70  ?CALCIUM 8.7* 9.0  ? ? ?PT/INR ?No results for input(s): LABPROT, INR in the last 72 hours. ?CMP  ?   ?Component Value Date/Time  ? NA 137 06/26/2021 0309  ? K 3.5 06/26/2021 0309  ? CL 105 06/26/2021 0309  ? CO2 26 06/26/2021 0309  ? GLUCOSE 131 (H) 06/26/2021 0309  ? BUN 6 06/26/2021 0309  ? CREATININE 0.70 06/26/2021 0309  ? CALCIUM 9.0  06/26/2021 0309  ? PROT 7.6 06/26/2021 0309  ? ALBUMIN 3.8 06/26/2021 0309  ? AST 335 (H) 06/26/2021 0309  ? ALT 340 (H) 06/26/2021 0309  ? ALKPHOS 156 (H) 06/26/2021 0309  ? BILITOT 2.9 (H) 06/26/2021 0309  ? GFRNONAA >60 06/26/2021 0309  ? GFRAA >60 12/14/2015 0805  ? ?Lipase  ?   ?Component Value Date/Time  ? LIPASE 22 06/25/2021 0319  ? ? ? ? ? ?Studies/Results: ?MR 3D Recon At Scanner ? ?Result Date: 06/25/2021 ?CLINICAL DATA:  Evaluate for choledocholithiasis. EXAM: MRI ABDOMEN WITHOUT AND WITH CONTRAST (INCLUDING MRCP) TECHNIQUE: Multiplanar multisequence MR imaging of the abdomen was performed both before and after the administration of intravenous contrast. Heavily T2-weighted images of the biliary and pancreatic ducts were obtained, and three-dimensional MRCP images were rendered by post processing. CONTRAST:  27m GADAVIST GADOBUTROL 1 MMOL/ML IV SOLN COMPARISON:  CT AP 06/25/2021 FINDINGS: Lower chest: Subsegmental atelectasis noted within the left lower lobe. No pleural effusion. Hepatobiliary: No focal suspicious enhancing liver abnormality. Status post cholecystectomy. The common bile duct measures 7 mm in maximum diameter. No signs of choledocholithiasis. Pancreas: No mass, inflammatory changes, or other parenchymal abnormality identified. Spleen:  Within normal limits in size and appearance. Adrenals/Urinary Tract: Normal adrenal glands. Simple appearing cyst within upper pole of right kidney measures 1.1 x 0.7 cm and is compatible with a benign Bosniak class 1  cyst. No follow-up recommended. No suspicious mass or hydronephrosis identified bilaterally. Stomach/Bowel: Stomach appears normal. No dilated loops of large or small bowel. Vascular/Lymphatic: Aortic atherosclerosis without aneurysm. No abdominopelvic adenopathy. Other: There is no free fluid or fluid collections identified within the abdomen or pelvis. Fluid signal intensity within the ventral abdominal wall overlying the liver likely  represents postoperative change from laparoscopic port. Musculoskeletal: No suspicious bone lesions identified. IMPRESSION: 1. No acute findings within the abdomen. 2. Status post cholecystectomy. No evidence of choledocholithiasis. Electronically Signed   By: Kerby Moors M.D.   On: 06/25/2021 17:03  ? ?MR ABDOMEN MRCP W WO CONTAST ? ?Result Date: 06/25/2021 ?CLINICAL DATA:  Evaluate for choledocholithiasis. EXAM: MRI ABDOMEN WITHOUT AND WITH CONTRAST (INCLUDING MRCP) TECHNIQUE: Multiplanar multisequence MR imaging of the abdomen was performed both before and after the administration of intravenous contrast. Heavily T2-weighted images of the biliary and pancreatic ducts were obtained, and three-dimensional MRCP images were rendered by post processing. CONTRAST:  81m GADAVIST GADOBUTROL 1 MMOL/ML IV SOLN COMPARISON:  CT AP 06/25/2021 FINDINGS: Lower chest: Subsegmental atelectasis noted within the left lower lobe. No pleural effusion. Hepatobiliary: No focal suspicious enhancing liver abnormality. Status post cholecystectomy. The common bile duct measures 7 mm in maximum diameter. No signs of choledocholithiasis. Pancreas: No mass, inflammatory changes, or other parenchymal abnormality identified. Spleen:  Within normal limits in size and appearance. Adrenals/Urinary Tract: Normal adrenal glands. Simple appearing cyst within upper pole of right kidney measures 1.1 x 0.7 cm and is compatible with a benign Bosniak class 1 cyst. No follow-up recommended. No suspicious mass or hydronephrosis identified bilaterally. Stomach/Bowel: Stomach appears normal. No dilated loops of large or small bowel. Vascular/Lymphatic: Aortic atherosclerosis without aneurysm. No abdominopelvic adenopathy. Other: There is no free fluid or fluid collections identified within the abdomen or pelvis. Fluid signal intensity within the ventral abdominal wall overlying the liver likely represents postoperative change from laparoscopic port.  Musculoskeletal: No suspicious bone lesions identified. IMPRESSION: 1. No acute findings within the abdomen. 2. Status post cholecystectomy. No evidence of choledocholithiasis. Electronically Signed   By: TKerby MoorsM.D.   On: 06/25/2021 17:03  ? ?NM HEPATOBILIARY LEAK (POST-SURGICAL) ? ?Result Date: 06/26/2021 ?CLINICAL DATA:  Abdominal pain with elevated bilirubin level status post cholecystectomy 06/12/2021. Concern for bile leak. EXAM: NUCLEAR MEDICINE HEPATOBILIARY IMAGING TECHNIQUE: Sequential images of the abdomen were obtained out to 60 minutes following intravenous administration of radiopharmaceutical. RADIOPHARMACEUTICALS:  7.59 mCi Tc-925mCholetec IV COMPARISON:  Abdominal ultrasound 06/12/2021. Abdominal pelvic CT and abdominal MRI 06/25/2021. FINDINGS: There is prompt hepatic uptake of the radiopharmaceutical. Through 2 hours of imaging, no definite biliary excretion of the radiopharmaceutical is identified. No bowel or extraluminal activity identified to suggest a bile leak. No significant biliary dilatation or choledocholithiasis on recent MRCP to suggest biliary obstruction. No suspicious fluid collections on imaging yesterday to suggest bile leak. IMPRESSION: 1. No biliary excretion of activity through 2 hours of imaging, most consistent with hepatitis or hepatic dysfunction. 2. In this setting, there is limited assessment for a bile leak. However, in correlation with recent CT and MR, no findings concerning for bile leak or biliary obstruction. Recommend clinical follow-up. Electronically Signed   By: WiRichardean Sale.D.   On: 06/26/2021 14:43   ? ?Anti-infectives: ?Anti-infectives (From admission, onward)  ? ? None  ? ?  ? ? ? ?Assessment/Plan ?RUQ abdominal pain s/p lap chole 3/20 by Dr. ThMarcello Moores- MRCP without acute findings/choledocholithiasis ?- LFTs still elevated but downtrending  but T bili 3.0 from 2.9. ?- hepatitis panel negative ?- HIDA 4/3 without biliary excretion consistent with  hepatitis or hepatic dysfunction and limited assessment for bile leak ?- US liver without acute abnormality ?- given persistently elevated LFTs and hepatitis vs hepatic dysfunction on HIDA will ask GI to evaluate. App

## 2021-06-28 LAB — COMPREHENSIVE METABOLIC PANEL
ALT: 242 U/L — ABNORMAL HIGH (ref 0–44)
AST: 133 U/L — ABNORMAL HIGH (ref 15–41)
Albumin: 3.7 g/dL (ref 3.5–5.0)
Alkaline Phosphatase: 141 U/L — ABNORMAL HIGH (ref 38–126)
Anion gap: 9 (ref 5–15)
BUN: <5 mg/dL — ABNORMAL LOW (ref 6–20)
CO2: 23 mmol/L (ref 22–32)
Calcium: 9 mg/dL (ref 8.9–10.3)
Chloride: 106 mmol/L (ref 98–111)
Creatinine, Ser: 0.65 mg/dL (ref 0.44–1.00)
GFR, Estimated: >60 mL/min (ref >60)
Glucose, Bld: 124 mg/dL — ABNORMAL HIGH (ref 70–99)
Potassium: 3.6 mmol/L (ref 3.5–5.1)
Sodium: 138 mmol/L (ref 135–145)
Total Bilirubin: 2.1 mg/dL — ABNORMAL HIGH (ref 0.3–1.2)
Total Protein: 7.4 g/dL (ref 6.5–8.1)

## 2021-06-28 LAB — CBC
HCT: 33.4 % — ABNORMAL LOW (ref 36.0–46.0)
Hemoglobin: 10.9 g/dL — ABNORMAL LOW (ref 12.0–15.0)
MCH: 25.5 pg — ABNORMAL LOW (ref 26.0–34.0)
MCHC: 32.6 g/dL (ref 30.0–36.0)
MCV: 78 fL — ABNORMAL LOW (ref 80.0–100.0)
Platelets: 394 10*3/uL (ref 150–400)
RBC: 4.28 MIL/uL (ref 3.87–5.11)
RDW: 17.2 % — ABNORMAL HIGH (ref 11.5–15.5)
WBC: 5.1 10*3/uL (ref 4.0–10.5)
nRBC: 0 % (ref 0.0–0.2)

## 2021-06-28 LAB — ANA W/REFLEX IF POSITIVE: Anti Nuclear Antibody (ANA): NEGATIVE

## 2021-06-28 LAB — ANTI-SMOOTH MUSCLE ANTIBODY, IGG: F-Actin IgG: 8 Units (ref 0–19)

## 2021-06-28 LAB — ALPHA-1-ANTITRYPSIN: A-1 Antitrypsin, Ser: 157 mg/dL (ref 100–188)

## 2021-06-28 LAB — CERULOPLASMIN: Ceruloplasmin: 30.6 mg/dL (ref 19.0–39.0)

## 2021-06-28 MED ORDER — BUTALBITAL-APAP-CAFFEINE 50-325-40 MG PO TABS: 1.0000 | ORAL_TABLET | ORAL | Status: DC | PRN

## 2021-06-28 MED ORDER — POLYETHYLENE GLYCOL 3350 17 G PO PACK
17.0000 g | PACK | Freq: Every day | ORAL | Status: DC
  Administered 2021-06-28: 17 g via ORAL
  Filled 2021-06-28 (×2): qty 1

## 2021-06-28 NOTE — Progress Notes (Signed)
? ?Progress Note ? ?   ?Subjective: ?No abdominal pain today. Tolerated clear liquids without nausea or emesis. Passing flatus. No BM since prior to admission ? ?Headache persists and did not improve with oxycodone or ibuprofen yesterday. She describes pain as achy constant in central forehead and right temple and exacerbated by light. No vision changes or aura. No neurologic symptoms - no numbness or tingling. She does not have history of headaches or migraines. ? ?Objective: ?Vital signs in last 24 hours: ?Temp:  [98.2 ?F (36.8 ?C)-98.7 ?F (37.1 ?C)] 98.7 ?F (37.1 ?C) (04/05 0534) ?Pulse Rate:  [77-91] 91 (04/05 0534) ?Resp:  [14-18] 16 (04/05 0534) ?BP: (116-147)/(74-85) 116/74 (04/05 0534) ?SpO2:  [96 %-97 %] 96 % (04/05 0534) ?Last BM Date : 06/24/21 ? ?Intake/Output from previous day: ?04/04 0701 - 04/05 0700 ?In: 3077.1 [P.O.:1200; I.V.:1877.1] ?Out: 2050 [Urine:2050] ?Intake/Output this shift: ?No intake/output data recorded. ? ?PE: ?General: pleasant, WD, female who is laying in bed in NAD ?HEENT: head is normocephalic, atraumatic.  Mouth is pink and moist ?Heart: Palpable radial pulses bilaterally ?Lungs: Respiratory effort nonlabored on room air ?Abd: soft, ND. No TTP today. ?MSK: all 4 extremities are symmetrical with no cyanosis, clubbing, or edema. Calves soft and NT bilaterally ?Skin: warm and dry ?Psych: A&Ox3 with an appropriate affect.  ? ? ?Lab Results:  ?Recent Labs  ?  06/27/21 ?0741 06/28/21 ?0403  ?WBC 5.5 5.1  ?HGB 10.2* 10.9*  ?HCT 32.3* 33.4*  ?PLT 370 394  ? ? ?BMET ?Recent Labs  ?  06/27/21 ?0741 06/28/21 ?0403  ?NA 137 138  ?K 3.6 3.6  ?CL 106 106  ?CO2 23 23  ?GLUCOSE 129* 124*  ?BUN <5* <5*  ?CREATININE 0.56 0.65  ?CALCIUM 8.8* 9.0  ? ? ?PT/INR ?Recent Labs  ?  06/27/21 ?1204  ?LABPROT 13.7  ?INR 1.1  ? ?CMP  ?   ?Component Value Date/Time  ? NA 138 06/28/2021 0403  ? K 3.6 06/28/2021 0403  ? CL 106 06/28/2021 0403  ? CO2 23 06/28/2021 0403  ? GLUCOSE 124 (H) 06/28/2021 0403  ? BUN <5  (L) 06/28/2021 0403  ? CREATININE 0.65 06/28/2021 0403  ? CALCIUM 9.0 06/28/2021 0403  ? PROT 7.4 06/28/2021 0403  ? ALBUMIN 3.7 06/28/2021 0403  ? AST 133 (H) 06/28/2021 0403  ? ALT 242 (H) 06/28/2021 0403  ? ALKPHOS 141 (H) 06/28/2021 0403  ? BILITOT 2.1 (H) 06/28/2021 0403  ? GFRNONAA >60 06/28/2021 0403  ? GFRAA >60 12/14/2015 0805  ? ?Lipase  ?   ?Component Value Date/Time  ? LIPASE 22 06/25/2021 0319  ? ? ? ? ? ?Studies/Results: ?US LIVER DOPPLER ? ?Result Date: 06/27/2021 ?CLINICAL DATA:  Postoperative abdominal pain EXAM: DUPLEX ULTRASOUND OF LIVER TECHNIQUE: Color and duplex Doppler ultrasound was performed to evaluate the hepatic in-flow and out-flow vessels. COMPARISON:  None. FINDINGS: Liver: Normal parenchymal echogenicity. Normal hepatic contour without nodularity. No focal lesion, mass or intrahepatic biliary ductal dilatation. Main Portal Vein size: 1.2 cm Portal Vein Velocities Main Prox:  42 cm/sec with normal hepatopetal directional flow. Main Mid: 32 cm/sec with normal hepatopetal directional flow. Main Dist:  37 cm/sec with normal hepatopetal directional flow. Right: 27 cm/sec with normal hepatopetal directional flow. Left: 18 cm/sec with normal hepatopetal directional flow. Hepatic Vein Velocities Right:  30 cm/sec Middle:  33 cm/sec Left:  23 cm/sec IVC: Present and patent with normal respiratory phasicity. Hepatic Artery Velocity:  102 cm/sec Splenic Vein Velocity:  48 cm/sec Spleen:  5.5 cm x 2.9 cm x 8.6 cm with a total volume of 71.4 cm^3 (411 cm^3 is upper limit normal) Portal Vein Occlusion/Thrombus: No Splenic Vein Occlusion/Thrombus: No Ascites: None Varices: None IMPRESSION: 1. Unremarkable hepatic Doppler ultrasound. 2. Hepatic and portal veins are widely patent with normal directional flow. 3. No focal liver abnormality. Signed, Criselda Peaches, MD, [facility] Electronically Signed   By: Jacqulynn Cadet M.D.   On:  06/27/2021 08:29  ? ?NM HEPATOBILIARY LEAK (POST-SURGICAL) ? ?Result Date: 06/26/2021 ?CLINICAL DATA:  Abdominal pain with elevated bilirubin level status post cholecystectomy 06/12/2021. Concern for bile leak. EXAM: NUCLEAR MEDICINE HEPATOBILIARY IMAGING TECHNIQUE: Sequential images of the abdomen were obtained out to 60 minutes following intravenous administration of radiopharmaceutical. RADIOPHARMACEUTICALS:  7.59 mCi Tc-50m Choletec IV COMPARISON:  Abdominal ultrasound 06/12/2021. Abdominal pelvic CT and abdominal MRI 06/25/2021. FINDINGS: There is prompt hepatic uptake of the radiopharmaceutical. Through 2 hours of imaging, no definite biliary excretion of the radiopharmaceutical is identified. No bowel or extraluminal activity identified to suggest a bile leak. No significant biliary dilatation or choledocholithiasis on recent MRCP to suggest biliary obstruction. No suspicious fluid collections on imaging yesterday to suggest bile leak. IMPRESSION: 1. No biliary excretion of activity through 2 hours of imaging, most consistent with hepatitis or hepatic dysfunction. 2. In this setting, there is limited assessment for a bile leak. However, in correlation with recent CT and MR, no findings concerning for bile leak or biliary obstruction. Recommend clinical follow-up. Electronically Signed   By: WRichardean SaleM.D.   On: 06/26/2021 14:43   ? ?Anti-infectives: ?Anti-infectives (From admission, onward)  ? ? None  ? ?  ? ? ? ?Assessment/Plan ?RUQ abdominal pain s/p lap chole 3/20 by Dr. TMarcello Moores?- MRCP without acute findings/choledocholithiasis ?- LFTs stable and T bili 2.1 (3.0) ?- hepatitis panel negative ?- HIDA 4/3 without biliary excretion consistent with hepatitis or hepatic dysfunction and limited assessment for bile leak ?- UKorealiver without acute abnormality ?- given persistently elevated LFTs and hepatitis vs hepatic dysfunction on HIDA asked GI to evaluate. Appreciate their input. Awaiting ASMA and AMA ?-  FLD ADAT soft ?- headache - did not improve with ibuprofen. Will try Fioricet today ?- intermittently hypertensive - she does not take medication for HTN at baseline. PRN hydralazine ? ?FEN: FLD ADAT soft ?ID: none currently ?VTE: scds, lovenox ? ? ? LOS: 2 days  ? ?MWinferd Humphrey PA-C ?[facility] ?06/28/2021, 10:22 AM ?Please see Amion for pager number during day hours 7:00am-4:30pm  ?

## 2021-06-28 NOTE — Progress Notes (Signed)
[facility] Progress Note ? ?Angela Ayers 41 y.o. [birth year] ? ?CC:  Abdominal pain ? ? ?Subjective: ?Patient examined laying in bed. She is tired but denies abdominal pain today. No bowel movements today. She has a headache today.  ? ?ROS : Review of Systems  ?Constitutional:  Negative for chills and fever.  ?Gastrointestinal:  Negative for abdominal pain, blood in stool, constipation, diarrhea, heartburn, melena, nausea and vomiting.  ?Genitourinary:  Negative for dysuria and urgency.  ?Neurological:  Positive for headaches.  ? ? ? ?Objective: ?Vital signs in last 24 hours: ?Vitals:  ? 06/27/21 2130 06/28/21 0534  ?BP: 137/85 116/74  ?Pulse: 77 91  ?Resp: 14 16  ?Temp: 98.2 ?F (36.8 ?C) 98.7 ?F (37.1 ?C)  ?SpO2: 97% 96%  ? ? ?Physical Exam: ? ?General:  Alert, cooperative, no distress, appears stated age  ?Head:  Normocephalic, without obvious abnormality, atraumatic  ?Eyes:  Anicteric sclera, EOM's intact  ?Lungs:   Clear to auscultation bilaterally, respirations unlabored  ?Heart:  Regular rate and rhythm, S1, S2 normal  ?Abdomen:   Soft, non-tender, bowel sounds active all four quadrants,  no masses,   ?Extremities: Extremities normal, atraumatic, no  edema  ?Pulses: 2+ and symmetric  ? ? ?Lab Results: ?Recent Labs  ?  06/27/21 ?0741 06/28/21 ?0403  ?NA 137 138  ?K 3.6 3.6  ?CL 106 106  ?CO2 23 23  ?GLUCOSE 129* 124*  ?BUN <5* <5*  ?CREATININE 0.56 0.65  ?CALCIUM 8.8* 9.0  ? ?Recent Labs  ?  06/27/21 ?0741 06/28/21 ?0403  ?AST 141* 133*  ?ALT 234* 242*  ?ALKPHOS 139* 141*  ?BILITOT 3.0* 2.1*  ?PROT 7.1 7.4  ?ALBUMIN 3.5 3.7  ? ?Recent Labs  ?  06/27/21 ?0741 06/28/21 ?0403  ?WBC 5.5 5.1  ?HGB 10.2* 10.9*  ?HCT 32.3* 33.4*  ?MCV 78.4* 78.0*  ?PLT 370 394  ? ?Recent Labs  ?  06/27/21 ?1204  ?LABPROT 13.7  ?INR 1.1  ? ? ? ? ?Assessment ?Abdominal pain, elevated LFTs ?-S/p laparoscopic cholecystectomy 2 weeks ago ?-MRCP 06/25/2021: No acute findings.  S/p cholecystectomy.  No evidence of choledocholithiasis.   Common bile duct measuring 7 mm ?-No concern for bile leak or biliary obstruction ?-US liver: Unremarkable hepatic Doppler ultrasound, hepatic and portal veins are widely patent ?-AST 133/ALT 242/alk phos 141, stable from yesterday ?-T. bili 2.1 improving from yesterday ?- hepatitis panel negative, normal Iron/TIBC, Ferritin, ceruloplasmin, A1-antitrypsin, normal PT/INR ?-Lipase normal ?- Awaiting ASMA, AMA ? ?Plan: ?LFTs stable from yesterday, T. Bili slightly improving.  MRCP negative for choledocholithiasis.  No concern for bile leak or biliary obstruction.  Hepatic Doppler ultrasound was unremarkable as well.  Negative hepatitis panel. ?Awaiting ASMA. AMA for evaluation of elevated LFTs.  ?Continue clear liquid diet as tolerated ?Continue supportive care ?[facility] will follow. ? ?Charlott Rakes PA-C ?06/28/2021, 10:04 AM ? ?Contact #  [phone] ? ?

## 2021-06-29 LAB — COMPREHENSIVE METABOLIC PANEL
ALT: 209 U/L — ABNORMAL HIGH (ref 0–44)
AST: 112 U/L — ABNORMAL HIGH (ref 15–41)
Albumin: 3.3 g/dL — ABNORMAL LOW (ref 3.5–5.0)
Alkaline Phosphatase: 106 U/L (ref 38–126)
Anion gap: 6 (ref 5–15)
BUN: 6 mg/dL (ref 6–20)
CO2: 22 mmol/L (ref 22–32)
Calcium: 8.7 mg/dL — ABNORMAL LOW (ref 8.9–10.3)
Chloride: 110 mmol/L (ref 98–111)
Creatinine, Ser: 0.64 mg/dL (ref 0.44–1.00)
GFR, Estimated: >60 mL/min (ref >60)
Glucose, Bld: 128 mg/dL — ABNORMAL HIGH (ref 70–99)
Potassium: 3.7 mmol/L (ref 3.5–5.1)
Sodium: 138 mmol/L (ref 135–145)
Total Bilirubin: 0.6 mg/dL (ref 0.3–1.2)
Total Protein: 6.6 g/dL (ref 6.5–8.1)

## 2021-06-29 MED ORDER — HYDRALAZINE HCL 20 MG/ML IJ SOLN
5.0000 mg | Freq: Once | INTRAMUSCULAR | Status: AC
  Administered 2021-06-29: 5 mg via INTRAVENOUS
  Filled 2021-06-29: qty 1

## 2021-06-29 MED ORDER — ASPIRIN-ACETAMINOPHEN-CAFFEINE 250-250-65 MG PO TABS
1.0000 | ORAL_TABLET | Freq: Once | ORAL | Status: AC
  Administered 2021-06-29: 1 via ORAL
  Filled 2021-06-29: qty 1

## 2021-06-29 MED ORDER — ASPIRIN-ACETAMINOPHEN-CAFFEINE 250-250-65 MG PO TABS: 1.0000 | ORAL_TABLET | Freq: Three times a day (TID) | ORAL | 0 refills | Status: AC | PRN

## 2021-06-29 MED ORDER — PANTOPRAZOLE SODIUM 40 MG PO TBEC: 40.0000 mg | DELAYED_RELEASE_TABLET | Freq: Every day | ORAL | Status: DC

## 2021-06-29 NOTE — Progress Notes (Signed)
Discharge instructions given to patient and all questions were answered.  

## 2021-06-29 NOTE — Discharge Summary (Signed)
[facility] ?Discharge Summary  ? ?Patient ID: ?Angela Ayers ?MRN: [id number] ?DOB/AGE: [birth year] 41 y.o. ? ?Admit date: 06/25/2021 ?Discharge date: 06/29/2021 ? ?Admitting Diagnosis: ?S/p laparoscopic cholecystectomy 06/12/21 ?Postoperative abdominal pain ?Elevated liver functions ? ?Discharge Diagnosis ?Patient Active Problem List  ? Diagnosis Date Noted  ? Right upper quadrant abdominal pain 06/26/2021  ? Abdominal pain 06/25/2021  ? Acute cholecystitis 06/12/2021  ? ? ?Consultants ?Gastroenterology ? ?Imaging: ?No results found. ? ?Procedures ?None ? ?Hospital Course:  ?Angela Ayers is a 41yo female who is about 2 weeks status post laparoscopic cholecystectomy by Dr. Marcello Moores.  She initially did well then had recurrence of her abdominal pain prompting her to come to the ED. CT scan showed normal postoperative changes and no other significant findings, but her LFTs were found to be significantly elevated. Patient was admitted to the surgical service for further work up. MRCP without acute findings/choledocholithiasis. HIDA without biliary excretion consistent with hepatitis or hepatic dysfunction and limited assessment for bile leak. Hepatitis panel negative. Gastroenterology consulted for assistance who added autoimmune markers; mitochondrial antibodies pending at time of discharge otherwise negative. Ultimately LFTs trended down and pain improved. Suspect patient may have passed a gallstone postoperatively. Diet was advanced as tolerated.  On 4/6 the patient was voiding well, tolerating diet, ambulating well, vital signs stable and felt stable for discharge home.  Patient will follow up as below with repeat labs and knows to call with questions or concerns.   ? ? ?Physical Exam: ?General:  Alert, NAD ?Pulm: rate and effort normal ?Abd:  soft, ND, NT, incisions cdi ? ?Allergies as of 06/29/2021   ? ?   Reactions  ? Latex   ? hives  ? ?  ? ?  ?Medication List  ?  ? ?STOP taking these medications    ? ?methocarbamol 500 MG tablet ?Commonly known as: ROBAXIN ?  ?metroNIDAZOLE 500 MG tablet ?Commonly known as: Flagyl ?  ?oxyCODONE 5 MG immediate release tablet ?Commonly known as: Oxy IR/ROXICODONE ?  ? ?  ? ?TAKE these medications   ? ?acetaminophen 500 MG tablet ?Commonly known as: TYLENOL ?Take 2 tablets (1,000 mg total) by mouth every 8 (eight) hours as needed. ?  ?aspirin-acetaminophen-caffeine 250-250-65 MG tablet ?Commonly known as: [place] ?Take 1 tablet by mouth every 8 (eight) hours as needed for headache. ?  ? ?  ? ? ? ? Follow-up Information   ? ? [facility], [place]. Go on 07/11/2021.   ?Specialty: General Surgery ?Why: Your appointment is 4/18 at 11:45pm ?Please arrive 30 minutes prior to your appointment to check in and fill out paperwork. Bring photo ID and insurance information. ?Contact information: ?[address] ?[address] ?[place] ?[phone] ? ?  ?  ? ? Inc, [facility]. Call.   ?Specialty: Pediatrics ?Why: Call to arrange follow up with your primary care physician. discuss elevated blood pressure and headaches ?Contact information: ?[address] ?[place] [address] ?[phone] ? ? ?  ?  ? ? [facility]. Go in 1 week(s).   ?Why: You need to have blood work prior to your appointment at [facility]. Please go 1-2 days prior to that appointment so we will have your results when you come back to see [place]. You do not have to have an appointment for the blood work, go sometime between [place] and 5pm Monday through Friday ?Contact information: ?[address] ?[address] ?[place], [place] ? ?  ?  ? ?  ?  ? ?  ? ? ?  Signed: ?Wellington Hampshire, PA-C ?[facility] ?06/29/2021, 10:48 AM ?Please see Amion for pager number during day hours 7:00am-4:30pm ? ?

## 2021-06-30 LAB — MITOCHONDRIAL ANTIBODIES: Mitochondrial M2 Ab, IgG: <20 Units (ref 0.0–20.0)

## 2022-12-13 ENCOUNTER — Encounter (HOSPITAL_BASED_OUTPATIENT_CLINIC_OR_DEPARTMENT_OTHER): Payer: Self-pay | Admitting: *Deleted

## 2022-12-13 ENCOUNTER — Emergency Department (HOSPITAL_BASED_OUTPATIENT_CLINIC_OR_DEPARTMENT_OTHER)
Admission: EM | Admit: 2022-12-13 | Discharge: 2022-12-13 | Disposition: A | Discharge status: Home / Self Care | Payer: Medicare Other | Attending: Emergency Medicine | Admitting: Emergency Medicine

## 2022-12-13 ENCOUNTER — Other Ambulatory Visit: Payer: Self-pay

## 2022-12-13 DIAGNOSIS — H00011 Hordeolum externum right upper eyelid: Secondary | ICD-10-CM | POA: Insufficient documentation

## 2022-12-13 DIAGNOSIS — H0289 Other specified disorders of eyelid: Secondary | ICD-10-CM | POA: Diagnosis present

## 2022-12-13 MED ORDER — CEPHALEXIN 500 MG PO CAPS: 500.0000 mg | ORAL_CAPSULE | Freq: Two times a day (BID) | ORAL | 0 refills | Status: AC

## 2022-12-13 NOTE — ED Provider Notes (Signed)
[facility] Provider Note   CSN: [id number] Arrival date & time: 12/13/22  1227     History  Chief Complaint  Patient presents with   Eye Pain    Angela Ayers is a 42 y.o. female presenting to the ED with 1-1/2 weeks of pain and swelling on the right upper eyelid.  She has been applying warm compresses with no relief.  She does not wear contacts and her vision is intact  HPI     Home Medications Prior to Admission medications   Medication Sig Start Date End Date Taking? Authorizing Provider  cephALEXin (KEFLEX) 500 MG capsule Take 1 capsule (500 mg total) by mouth 2 (two) times daily for 7 days. 12/13/22 12/20/22 Yes Kashmere Staffa, Kermit Balo, MD  acetaminophen (TYLENOL) 500 MG tablet Take 2 tablets (1,000 mg total) by mouth every 8 (eight) hours as needed. 06/13/21   Maczis, Elmer Sow, PA-C  aspirin-acetaminophen-caffeine (EXCEDRIN MIGRAINE) [phone] MG tablet Take 1 tablet by mouth every 8 (eight) hours as needed for headache. 06/29/21   Meuth, Lina Sar, PA-C      Allergies    Latex    Review of Systems   Review of Systems  Physical Exam Updated Vital Signs BP (!) 153/91 (BP Location: Right Arm)   Pulse 86   Temp 98.6 F (37 C)   Resp 18   Wt 122.4 kg   LMP 11/24/2022 (Exact Date)   SpO2 96%   BMI 44.90 kg/m  Physical Exam Constitutional:      General: She is not in acute distress. HENT:     Head: Normocephalic and atraumatic.  Eyes:     General:        Right eye: No discharge.        Left eye: No discharge.     Extraocular Movements: Extraocular movements intact.     Conjunctiva/sclera: Conjunctivae normal.     Pupils: Pupils are equal, round, and reactive to light.     Comments: Stye present with approximate 1 cm collection in the middle of the right upper lobe, no active drainage.  Cardiovascular:     Rate and Rhythm: Normal rate and regular rhythm.  Pulmonary:     Effort: Pulmonary effort is normal. No respiratory  distress.  Skin:    General: Skin is warm and dry.  Neurological:     General: No focal deficit present.     Mental Status: She is alert. Mental status is at baseline.  Psychiatric:        Mood and Affect: Mood normal.        Behavior: Behavior normal.     ED Results / Procedures / Treatments   Labs (all labs ordered are listed, but only abnormal results are displayed) Labs Reviewed - No data to display  EKG None  Radiology No results found.  Procedures Procedures    Medications Ordered in ED Medications - No data to display  ED Course/ Medical Decision Making/ A&P                                 Medical Decision Making  Patient is here with an approximate 1 cm stye in the right upper eyelid.  There is perhaps some mild surrounding edema of the eyelid, no evident preseptal cellulitis.  I do think is reasonable to start an antibiotic course.  She can continue with warm compresses but given  the size of this, I do think it might also be reasonable to offer information referral to ophthalmology if she were to require drainage        Final Clinical Impression(s) / ED Diagnoses Final diagnoses:  Hordeolum externum of right upper eyelid    Rx / DC Orders ED Discharge Orders          Ordered    cephALEXin (KEFLEX) 500 MG capsule  2 times daily        12/13/22 1411              Terald Sleeper, MD 12/13/22 [phone]

## 2022-12-13 NOTE — ED Triage Notes (Signed)
Pt is here for right upper eyelid pain and swelling.  Pt has large knot, large stye on right upper lid.  Pt has been doing warm and cold compresses at home.

## 2022-12-13 NOTE — Discharge Instructions (Addendum)
You can continue applying warm compresses to your upper eyelid for 10 minutes at a time, at least 4 times a day.  This may eventually lead to some drainage and reduction of the swelling in your eye.  But if it is not improving you will need to be seen by an ophthalmologist.  I also prescribed you an antibiotic to take for several days.
